# Patient Record
Sex: Female | Born: 2000 | Race: White | Hispanic: No | Marital: Single | Smoking: Never smoker
Health system: Southern US, Community
[De-identification: ages and names within clinical notes are randomized; demographics above are authoritative.]

## PROBLEM LIST (undated history)

## (undated) DIAGNOSIS — Z9109 Other allergy status, other than to drugs and biological substances: Secondary | ICD-10-CM

## (undated) DIAGNOSIS — L0291 Cutaneous abscess, unspecified: Secondary | ICD-10-CM

## (undated) DIAGNOSIS — Z9889 Other specified postprocedural states: Secondary | ICD-10-CM

## (undated) DIAGNOSIS — F988 Other specified behavioral and emotional disorders with onset usually occurring in childhood and adolescence: Secondary | ICD-10-CM

## (undated) DIAGNOSIS — F909 Attention-deficit hyperactivity disorder, unspecified type: Secondary | ICD-10-CM

## (undated) DIAGNOSIS — G43809 Other migraine, not intractable, without status migrainosus: Secondary | ICD-10-CM

## (undated) DIAGNOSIS — E739 Lactose intolerance, unspecified: Secondary | ICD-10-CM

## (undated) DIAGNOSIS — R519 Headache, unspecified: Secondary | ICD-10-CM

## (undated) DIAGNOSIS — K829 Disease of gallbladder, unspecified: Secondary | ICD-10-CM

## (undated) DIAGNOSIS — M2550 Pain in unspecified joint: Secondary | ICD-10-CM

## (undated) DIAGNOSIS — E559 Vitamin D deficiency, unspecified: Secondary | ICD-10-CM

## (undated) DIAGNOSIS — E669 Obesity, unspecified: Secondary | ICD-10-CM

## (undated) DIAGNOSIS — F329 Major depressive disorder, single episode, unspecified: Secondary | ICD-10-CM

## (undated) DIAGNOSIS — N764 Abscess of vulva: Secondary | ICD-10-CM

## (undated) DIAGNOSIS — K589 Irritable bowel syndrome without diarrhea: Secondary | ICD-10-CM

## (undated) DIAGNOSIS — J479 Bronchiectasis, uncomplicated: Secondary | ICD-10-CM

## (undated) DIAGNOSIS — K219 Gastro-esophageal reflux disease without esophagitis: Secondary | ICD-10-CM

## (undated) DIAGNOSIS — K8020 Calculus of gallbladder without cholecystitis without obstruction: Secondary | ICD-10-CM

## (undated) DIAGNOSIS — R112 Nausea with vomiting, unspecified: Secondary | ICD-10-CM

## (undated) DIAGNOSIS — B449 Aspergillosis, unspecified: Secondary | ICD-10-CM

## (undated) DIAGNOSIS — F419 Anxiety disorder, unspecified: Secondary | ICD-10-CM

## (undated) DIAGNOSIS — R12 Heartburn: Secondary | ICD-10-CM

## (undated) DIAGNOSIS — M779 Enthesopathy, unspecified: Secondary | ICD-10-CM

## (undated) DIAGNOSIS — F32A Depression, unspecified: Secondary | ICD-10-CM

## (undated) HISTORY — DX: Depression, unspecified: F32.A

## (undated) HISTORY — DX: Headache, unspecified: R51.9

## (undated) HISTORY — DX: Heartburn: R12

## (undated) HISTORY — PX: RHINOPLASTY: SUR1284

## (undated) HISTORY — DX: Attention-deficit hyperactivity disorder, unspecified type: F90.9

## (undated) HISTORY — DX: Pain in unspecified joint: M25.50

## (undated) HISTORY — DX: Other migraine, not intractable, without status migrainosus: G43.809

## (undated) HISTORY — DX: Lactose intolerance, unspecified: E73.9

## (undated) HISTORY — DX: Bronchiectasis, uncomplicated: J47.9

## (undated) HISTORY — DX: Aspergillosis, unspecified: B44.9

## (undated) HISTORY — DX: Vitamin D deficiency, unspecified: E55.9

## (undated) HISTORY — DX: Disease of gallbladder, unspecified: K82.9

## (undated) HISTORY — DX: Calculus of gallbladder without cholecystitis without obstruction: K80.20

## (undated) HISTORY — DX: Other specified behavioral and emotional disorders with onset usually occurring in childhood and adolescence: F98.8

## (undated) HISTORY — DX: Irritable bowel syndrome, unspecified: K58.9

## (undated) HISTORY — DX: Abscess of vulva: N76.4

---
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
## 2021-01-08 NOTE — Progress Notes (Addendum)
Asthma Control Test for people 12 years and older                                            NAME_______________________________                 MR#________________________________ Circle the most appropriate answer:                          DATE_______________________________ 1. In the past 4 weeks, how much of the time did your asthma keep you from getting as much done at work, school, or home? All of the time                   1 Most of the time                2  Some of the time                3 A little of the time                4 None of the time                   5  2. During the past 4 weeks, how often have you had shortness of breath? More than once a day                            1 Once a day            2 3 to 5 times a week                  3 Once or twice a week                       4 Not at all           5  3. During the past 4 weeks, how often did your asthma symptoms (wheezing, coughing, shortness of breath, chest tightness, or pain) wake you up at night or earlier than usual in the morning? 4 or more nights a week                    1 2 or 3 nights a week                  2 Once a week             3 Once or twice             4 Not at all         5  4. During the past 4 weeks, how often have you used your rescue inhaler or nebulizer medication (such as albuterol, ProAir, Ventolin, or Xopenex)? 3 or more times a day 1 1 or 2 times a day 2 2 or 3 times a week 3 Once a week or less 4 Not at all 5  5. How would you rate your asthma control during the past 4 weeks? Not controlled at all                   1 Poorly controlled  2 Somewhat controlled                   3 Well controlled                4 Completely controlled                    5   Now add up the numbers from each question to get your score: __25_______________________ And answer the questions below: 1. Does your child have an Asthma Action Plan?                          YES           NO   -If yes, does it list your child's current medications?         YES                       NO     Were any new medications for asthma prescribed since your last visit?       YES  NO If yes, please list: _________________________________________________________________________ 2. Has your child had a visit to the ER or an urgent visit to the doctor for asthma since your last visit?   YES   NO -If yes, how many times? _______________________________ 3. Has your child been hospitalized for asthma since your last visit?      YES   NO -If yes, when and where? ________________________________ 5.   Has your child had to miss any school in the last 4 months due to his/her asthma?   YES   NO -If yes, approximately how many days? ____________________ 6    Has your Child had a Flu Shot?  _______________     YES                                  NO          -If yes, when was the Flu Shot given? _______________ (month) 7. Would you like your child to have a Flu Shot today?             YES                 NO

## 2021-01-11 ENCOUNTER — Other Ambulatory Visit: Payer: Self-pay

## 2021-01-11 ENCOUNTER — Encounter (INDEPENDENT_AMBULATORY_CARE_PROVIDER_SITE_OTHER): Payer: Self-pay | Admitting: Pediatrics

## 2021-01-11 ENCOUNTER — Ambulatory Visit (INDEPENDENT_AMBULATORY_CARE_PROVIDER_SITE_OTHER): Payer: Medicaid Other | Admitting: Pediatrics

## 2021-01-11 VITALS — BP 108/70 | HR 108 | Wt >= 6400 oz

## 2021-01-11 DIAGNOSIS — Z902 Acquired absence of lung [part of]: Secondary | ICD-10-CM

## 2021-01-11 DIAGNOSIS — B449 Aspergillosis, unspecified: Secondary | ICD-10-CM | POA: Diagnosis not present

## 2021-01-11 DIAGNOSIS — J45909 Unspecified asthma, uncomplicated: Secondary | ICD-10-CM

## 2021-01-11 DIAGNOSIS — J479 Bronchiectasis, uncomplicated: Secondary | ICD-10-CM

## 2021-01-11 MED ORDER — MONTELUKAST SODIUM 10 MG PO TABS: 10.0000 mg | ORAL_TABLET | Freq: Every day | ORAL | 11 refills | Status: DC

## 2021-01-11 NOTE — Patient Instructions (Signed)
Pediatric Pulmonology  Clinic Discharge Instructions       01/11/21    It was great to see you today Ashley Cantu! You can continue using Singulair (montelukast). No other changes at this time. Please let me know if your asthma symptoms get worse.    Followup: Return in about 9 months (around 10/13/2021).  Please call [phone] with any further questions or concerns.    Pediatric Pulmonology   Asthma Management Plan for Ashley Cantu Printed: 01/11/2021  Asthma Severity: Mild Persistent Asthma Avoid Known Triggers: Tobacco smoke exposure and Respiratory infections (colds)  GREEN ZONE  Child is DOING WELL. No cough and no wheezing. Child is able to do usual activities. Take these Daily Maintenance medications Singulair (Montelukat) 10mg  once a day by mouth at bedtime   YELLOW ZONE  Asthma is GETTING WORSE.  Starting to cough, wheeze, or feel short of breath. Waking at night because of asthma. Can do some activities. 1st Step - Take Quick Relief medicine below.  If possible, remove the child from the thing that made the asthma worse. Albuterol 2-4 puffs   2nd  Step - Do one of the following based on how the response.  If symptoms are not better within 1 hour after the first treatment, call Cox, [name], MD at [phone].  Continue to take GREEN ZONE medications.  If symptoms are better, continue this dose for 2 day(s) and then call the office before stopping the medicine if symptoms have not returned to the [place]. Continue to take GREEN ZONE medications.      RED ZONE  Asthma is VERY BAD. Coughing all the time. Short of breath. Trouble talking, walking or playing. 1st Step - Take Quick Relief medicine below:  Albuterol 4-6 puffs     2nd Step - Call [name], MD at [phone] immediately for further instructions.  Call 911 or go to the Emergency Department if the medications are not working.   Spacer and Mouthpiece  Correct Use of MDI and Spacer with  Mouthpiece  Below are the steps for the correct use of a metered dose inhaler (MDI) and spacer with MOUTHPIECE.  Patient should perform the following steps: 1.  Shake the canister for 5 seconds. 2.  Prime the MDI. (Varies depending on MDI brand, see package insert.) In general: -If MDI not used in 2 weeks or has been dropped: spray 2 puffs into air -If MDI never used before spray 3 puffs into air 3.  Insert the MDI into the spacer. 4.  Place the spacer mouthpiece into your mouth between the teeth. 5.  Close your lips around the mouthpiece and exhale normally. 6.  Press down the top of the canister to release 1 puff of medicine. 7.  Inhale the medicine through the mouth deeply and slowly (3-5 seconds spacer whistles when breathing in too fast.  8.  Hold your breath for 10 seconds and remove the spacer from your mouth before exhaling. 9.  Wait one minute before giving another puff of the medication. 10.Caregiver supervises and advises in the process of medication administration with spacer.             11.Repeat steps 4 through 8 depending on how many puffs are indicated on the prescription.  Cleaning Instructions 1. Remove the rubber end of spacer where the MDI fits. 2. Rotate spacer mouthpiece counter-clockwise and lift up to remove. 3. Lift the valve off the clear posts at the end of the chamber. 4. Soak the  parts in warm water with clear, liquid detergent for about 15 minutes. 5. Rinse in clean water and shake to remove excess water. 6. Allow all parts to air dry. DO NOT dry with a towel.  7. To reassemble, hold chamber upright and place valve over clear posts. Replace spacer mouthpiece and turn it clockwise until it locks into place. Replace the back rubber end onto the spacer.   For more information, go to [email or web address]

## 2021-01-11 NOTE — Progress Notes (Signed)
Pediatric Pulmonology  Clinic Note  01/11/2021  Primary Care Physician: [name], MD   Reason For Visit: Followup for bronchiectasis and fungal pulmonary infection  Assessment and Plan:  Ashley Cantu is a 20 y.o. 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 Recently underwent surgical resection given lack of radiographic and symptomatic improvement after a year on [place]. Doing very well post-surgery and since stopping Cresemba, with no significant symptoms of residual infection. Most recent xray in nov looked great. Lung function is normal now - with no obstruction or restriction. Though she does likely still have some areas of bronchiectasis in her right middle lobe - she does not appear to be having any associated symptoms from that - and we discussed that hopefully will not have infection or other issues in the future.   Plan: - No interventions needed at this time - will need to continue to monitor for signs of infections given some residual bronchiectasis  Asthma - mild persistent: Asthma symptoms much improved, with significant improvement on PFT's. Well controlled now. Will continue Singulair (montelukast).  - continue Singulair (montelukast)  - continue albuterol prn - Asthma Action Plan provided.  Healthcare Maintenance: - Adrena has received a flu vaccine this season.  - Has received 2 covid vaccines and a booster  Followup: Return in about 9 months (around 10/13/2021).      Gwyndolyn Saxon "Will" Guayanilla Cellar, MD [facility] Pediatric Pulmonology [place] Office: [phone] [facility] Office [phone]   Subjective:  Ashley Cantu is a 20 y.o. 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    Ashley Cantu was last seen by myself in clinic on 08/24/2020. At that time, she was doing fairly well following her lobectomy, but was having more asthma symptoms, so we started her on Flovent 110mg 2 puffs BID.   She was seen by Dr. VTommy Medalin January - and the plan was to discontinue the Cresemba later that month.  Ashley Cantu reports that her breathing has been great recently. Her shortness of breath and cough that she was having at her last visit have significantly improved. She never did start the flovent after her last visit due to pharmacy issues, but her symptoms seemed to improve with just Singulair (montelukast). She did stop Cresemba in January - and did not notice any change or worsening of symptoms after stopping. She does not cough up much mucus at all now - and what she does cough up is clear. She does not have shortness of breath/ wheezing with activity, nighttime cough, or chronic cough during the day. She only uses albuterol ~1x/ month.   Allergy symptoms have been a little worse recently in the spring but overall fairly well controlled. Taking Singulair (montelukast) and sometimes nasal fluticasone (Flonase). No apparent medication side effects from Singulair (montelukast).   Past Medical History:   Patient Active Problem List   Diagnosis Date Noted  . Bipolar 2 disorder, major depressive episode ([place] 08/09/2020  . 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  . ADHD (attention deficit hyperactivity disorder) 03/05/2020  . Migraine without aura and without status migrainosus, not intractable 12/14/2019  . 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  Vitamin D deficiency 10/19/2019  . Body mass index (BMI) greater than 99th percentile for age in childhood 10/17/2019  . 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 Frequent episodic tension-type headache, not intractable 07/12/2019  .  Migraine variant with headache 07/12/2019  . Hyperuricemia 04/15/2019  . Asthma 10/15/2018  . Aspergilloma ([place]) 09/28/2018  . Pneumonia with the fungal infection aspergillosis ([place]) 07/02/2018  . Bronchiectasis without complication ([place]) 26/71/2458  . Intermittent hypertension 12/02/2017  . Left genital labial abscess 08/06/2017  . Enlargement of right atrium 04/22/2017  . Hepatic steatosis 04/22/2017  . Gastroesophageal reflux disease 01/23/2017  . Generalized anxiety disorder 09/10/2016  . Obesity with serious comorbidity and body mass index (BMI) greater than 99th percentile for age in pediatric patient 09/10/2016  . Binge eating 09/10/2016  . Severe obesity due to excess calories with serious comorbidity and body mass index (BMI) greater than 99th percentile for age in pediatric patient [facility]) 09/10/2016    Past Surgical History:  Procedure Laterality Date  . CHOLECYSTECTOMY N/A 04/01/2017   Procedure: LAPAROSCOPIC CHOLECYSTECTOMY;  Surgeon: Stanford Scotland, MD;  Location: [place];  Service: General;  Laterality: N/A;  . RHINOPLASTY    . VIDEO BRONCHOSCOPY WITH ENDOBRONCHIAL NAVIGATION N/A 07/11/2020   Procedure: VIDEO BRONCHOSCOPY WITH ENDOBRONCHIAL NAVIGATION  WITH INDOCYANINE GREEN/METHYLENE BLUE MARKING INJECTION OF RIGHT UPPER LOBE MASS;  Surgeon: Grace Isaac, MD;  Location: [facility];  Service: Thoracic;  Laterality: N/A;   Medications:   Current Outpatient Medications:  .  cetirizine (ZYRTEC) 10 MG tablet, Take 10 mg by mouth daily., Disp: , Rfl:  .  fluticasone (FLONASE) 50 MCG/ACT nasal spray, Place 2 sprays into both nostrils daily. , Disp: , Rfl: 5 .  hydrOXYzine (ATARAX/VISTARIL) 10 MG tablet, Take 1 tablet (10 mg total) by mouth 3 (three) times daily as needed., Disp: 90 tablet, Rfl: 2 .  lamoTRIgine (LAMICTAL) 100 MG tablet, Take 1 tablet (100 mg total) by mouth daily., Disp: 30 tablet, Rfl: 2 .  meloxicam (MOBIC) 15 MG tablet, Take 15 mg by mouth daily as needed for  pain. , Disp: , Rfl:  .  NEXIUM 40 MG capsule, Take 40 mg by mouth daily. , Disp: , Rfl: 1 .  SUMAtriptan (IMITREX) 25 MG tablet, Take 1 tablet (25 mg total) by mouth every 2 (two) hours as needed for migraine. May repeat in 2 hours if headache persists or recurs., Disp: 10 tablet, Rfl: 0 .  traZODone (DESYREL) 50 MG tablet, Take 1 tablet (50 mg total) by mouth at bedtime., Disp: 30 tablet, Rfl: 2 .  albuterol (PROVENTIL HFA;VENTOLIN HFA) 108 (90 BASE) MCG/ACT inhaler, Inhale 2 puffs into the lungs every 6 (six) hours as needed for wheezing or shortness of breath.  (Patient not taking: Reported on 01/11/2021), Disp: , Rfl:  .  montelukast (SINGULAIR) 10 MG tablet, Take 1 tablet (10 mg total) by mouth at bedtime., Disp: 30 tablet, Rfl: 11 .  topiramate (TOPAMAX) 25 MG tablet, Take 3 tablets at nighttime (Patient not taking: Reported on 01/11/2021), Disp: 93 tablet, Rfl: 5 .  Vitamin D, Ergocalciferol, (DRISDOL) 1.25 MG (50000 UNIT) CAPS capsule, Take one tablet every Sunday and take one tablet every Wednesday. (Patient not taking: Reported on 01/11/2021), Disp: 8 capsule, Rfl: 0  Social History:   Social History   Social History Narrative   Ashley Cantu received her GED.   She attended [employer].   She lives with her dad only.   She has a younger brother.   She works  at [employer].     Lives with parents in [place] [address].   Objective:  Vitals Signs: BP 108/70   Pulse (!) 108   Wt (!) 461 lb (209.1 kg)   SpO2 97%   BMI 70.09 kg/m   Wt Readings from Last 3 Encounters:  01/11/21 (!) 461 lb (209.1 kg) (>99 %, Z= 3.13)*  10/04/20 (!) 451 lb 3.2 oz (204.7 kg) (>99 %, Z= 3.08)*  09/26/20 (!) 436 lb (197.8 kg) (>99 %, Z= 3.06)*   * Growth percentiles are based on CDC (Girls, 2-20 Years) data.   GENERAL: Appears comfortable and in no respiratory distress.  ENT:  Moist mucous membranes.  NECK:  Supple, without adenopathy.  RESPIRATORY:  Clear to auscultation bilaterally, somewhat  distant, normal work and rate of breathing with no retractions, no crackles or wheezes, with symmetric breath sounds throughout.  No clubbing.  CARDIOVASCULAR:  Regular rate and rhythm without murmur.  Nailbeds are pink. No lower extremity edema.  NEUROLOGIC:  Normal strength and tone x 4.  Medical Decision Making:   Spirometry (% predicted): FVC: 119% FEV1: 108% FEV1/FVC: 91% FEF25-75: 94% Interpretation: Acceptable per ATS criteria. Normal - no obstruction.   08/24/20: FVC: 107%/ 74%/ 29%  02/10/19 % pred: FVC 128%, FEV113%, FEV1/FVC 88%, FEF 25-75% Interpretation: technically not repeatable, but FEV1 is improved from prior and within her prior range. No obstruction per ATS criteria.   07/29/19: FVC 120% FEV1 92% FEV1/FVC 76% FEF25-75 66% Interpretation - consistent with mild observation given decreased FEV1/FVC ratio - slightly decreased since prior testing.   From 09/2018: FVC 128% pred FEV1 122% FEV1/FVC 95% pred FEF25-75% 102% Spirometry is normal today. No significant change from prior testing. Testing meets ATS standards.   Radiology: CT chest 03/19/18: IMPRESSION: 1. Worsening areas of bronchiectasis and chronic post infectious or inflammatory scarring. Today's study also demonstrates an enlarging cavitary area in the medial aspect of the right upper lobe which contain some internal dependent debris. The possibility of a developing mycetoma should be considered. Outpatient referral to Pulmonology for further evaluation is strongly recommended.  CT Chest: 09/07/18 Lungs/Pleura: Nodular airspace disease in the anteromedial right upper lobe is stable. Volume loss with bronchiectasis and airspace consolidation in the right middle lobe is similar to prior. Patchy micro nodular opacity posterior right apex is unchanged. Left lung unremarkable. No pleural effusion.  Upper Abdomen: Unremarkable  Musculoskeletal: No worrisome lytic or sclerotic  osseous abnormality.  IMPRESSION: 1. Stable exam. Areas of bronchiectasis with volume loss and scarring in the right middle lobe. The cavitary disease in the medial right upper lobe seen previously is similar today.  08/15/19: IMPRESSION: 1. No significant change in numerous scattered ground-glass opacities of the right upper lobe (series 3, image 30).  2. 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  3. Unchanged severe bronchiectasis, fibrotic scarring, and volume loss of the right middle lobe, particularly the medial segment.  4. Overall constellation of findings is in keeping with sequelae of aspergillus pneumonia and without evidence of ongoing active Infection.  Flexible bronchoscopy: 06/01/18: Diagnosis: 1. Pharyngomalacia - moderate 2. Bronchitis - moderate and localized to right lung   Discussion: Hala's bronchoscopy revealed moderate pharyngomalacia that could be obstructive, however exam was limited given insertion of LMA. She had thick stringy mucus throughout her right lung, particularly in her right middle  and right upper lobe, consistent with past bronchoscopy at [facility] and her radiographic imaging. A bronchial alveolar lavage sample was collected which will help guide future antimicrobial therapy. Ciliary biopsy will be processed to evaluate for possible primary ciliary dyskinesia (PCD).   Bronchoalveolar lavage culture results:  10,000 CFU/mL Haemophilus influenzae beta-lactamase     Aspergillus fumigatus    Fungal culture: Aspergillus  Bronchoalveolar lavage cell count:  95% neutrophils   AFB smear: negative Respiratory pathogen panel: negative  Ciliary biopsy: Normal   DG Chest 2 View CLINICAL DATA:  Status post right lobectomy.  EXAM: CHEST - 2 VIEW  COMPARISON:  July 13, 2020.  FINDINGS: The heart size and mediastinal contours are within normal limits. Both lungs are clear. No pneumothorax or pleural effusion is noted. The visualized skeletal structures are unremarkable.  IMPRESSION: No active cardiopulmonary disease.  Electronically Signed   By: Marijo Conception M.D.   On: 07/24/2020 12:42  Asthma Control Test: 25 Indicating that asthma is well controlled (20 or greater)

## 2021-01-22 ENCOUNTER — Other Ambulatory Visit: Payer: Self-pay

## 2021-01-22 ENCOUNTER — Ambulatory Visit (INDEPENDENT_AMBULATORY_CARE_PROVIDER_SITE_OTHER): Payer: Medicaid Other | Admitting: Licensed Clinical Social Worker

## 2021-01-22 DIAGNOSIS — F3181 Bipolar II disorder: Secondary | ICD-10-CM

## 2021-01-22 DIAGNOSIS — F411 Generalized anxiety disorder: Secondary | ICD-10-CM | POA: Diagnosis not present

## 2021-01-22 NOTE — Progress Notes (Signed)
   THERAPIST PROGRESS NOTE  Session Time: 32   Participation Level: Active  Behavioral Response: CasualAlertAnxious  Type of Therapy: Individual Therapy  Treatment Goals addressed: Diagnosis: GAD and bipolar depression   Interventions: CBT and Supportive  Summary: Ashley Cantu is a 20 y.o. female who presents with: bipolar disorder and GAD    Suicidal/Homicidal: NAwithout intent/plan  Therapist Response:    Subjective/Objective:  Pt was alert and oriented x 5. She was dressed casually and engaged well in therapy session. Pt presented with euthymic mood/affect.   She presented today after 2 months of not being seen. This was a scheduled visit for discharge from practice as pt states the symptoms have been improving steadily. This was as evidence by original PHQ-9 score being 19 and last score being a 4. Ashley Cantu is comfortable with discharge plan to remain on medication.    Assessment: Pt endorse an improvement in depression symptoms for worthlessness, hopelessness, fatigue, and irritability. She is taking her medications as prescribed. It was explained to pt that she is more than welcome to come back to practice for therapy for reassessment     Plan: Return again in 45   weeks.      Dory Horn, LCSW 01/22/2021

## 2021-01-24 ENCOUNTER — Ambulatory Visit (INDEPENDENT_AMBULATORY_CARE_PROVIDER_SITE_OTHER): Payer: Medicaid Other | Admitting: Pediatrics

## 2021-01-24 ENCOUNTER — Other Ambulatory Visit: Payer: Self-pay

## 2021-01-24 ENCOUNTER — Encounter (INDEPENDENT_AMBULATORY_CARE_PROVIDER_SITE_OTHER): Payer: Self-pay

## 2021-01-24 ENCOUNTER — Encounter (INDEPENDENT_AMBULATORY_CARE_PROVIDER_SITE_OTHER): Payer: Self-pay | Admitting: Pediatrics

## 2021-01-24 VITALS — BP 120/70 | HR 80 | Ht 68.0 in | Wt >= 6400 oz

## 2021-01-24 DIAGNOSIS — G43009 Migraine without aura, not intractable, without status migrainosus: Secondary | ICD-10-CM

## 2021-01-24 DIAGNOSIS — E878 Other disorders of electrolyte and fluid balance, not elsewhere classified: Secondary | ICD-10-CM

## 2021-01-24 DIAGNOSIS — Z68.41 Body mass index (BMI) pediatric, greater than or equal to 95th percentile for age: Secondary | ICD-10-CM | POA: Diagnosis not present

## 2021-01-24 DIAGNOSIS — G43809 Other migraine, not intractable, without status migrainosus: Secondary | ICD-10-CM

## 2021-01-24 MED ORDER — TOPIRAMATE 25 MG PO TABS: ORAL_TABLET | ORAL | 5 refills | Status: DC

## 2021-01-24 NOTE — Patient Instructions (Signed)
It was a pleasure to see you.  I am glad that your symptoms have been controlled completely with topiramate.  Please let me know if there is any change between now and when I see you again in 4 months.  We will try to identify a specific adult provider for you.

## 2021-01-24 NOTE — Progress Notes (Signed)
Patient: Ashley Cantu MRN: [id number] Sex: female DOB: Jan 31, 2001  Provider: Wyline Copas, MD Location of Care: [facility] Child Neurology  Note type: Routine return visit  History of Present Illness: Referral Source: Casilda Carls, MD History from: patient and [facility] chart Chief Complaint: Headache  Ashley Cantu is a 20 y.o. female who was evaluated Jan 24, 2021 for the first time since September 26, 2020.  She has migraine variant associated with disequilibrium and nausea and also migraine without aura with headaches that are frontal and vertex associated with nausea without vomiting and a feeling of near syncope associated with her dizziness.  She denies sensitivity to light and sound.  Pain is incapacitating.  She has not experienced any headaches or other symptoms since her last visit.  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  She is been vaccinated for COVID and got boosted.  She has not contracted COVID.  She takes and tolerates topiramate.  She is a Programmer, systems.  She will attend an [employer] that is located in [place].  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  Review of Systems: A complete review of systems was assessed and was negative.  Past Medical History Diagnosis Date  . Abscess   . Abscess of right genital labia 10/14/2018  . ADD (attention deficit disorder)   . ADHD   . Anxiety    and depression  . Aspergilloma ([place]) 09/28/2018  . Asthma when she was a baby  . Bone spur   . Bronchiectasis ([place])   . Cholelithiasis 04/01/2017  . Cough 10/15/2018  . Depression   . Environmental allergies   . Gallbladder problem   . GERD (gastroesophageal reflux disease)   . Headache   . Heartburn   . IBS (irritable bowel syndrome)   . Joint pain   . Lactose intolerance   . Migraine variant   . Obesity   . PONV (postoperative nausea and vomiting)    with  the rhinoplasty surgery  . Vitamin D deficiency    Hospitalizations: No., Head Injury: No., Nervous System Infections: No., Immunizations up to date: Yes.    Birth History 8lbs. 0oz. infant born at [redacted]weeks gestational age to a g 1p 60female. Gestation wasuncomplicated Mother receivedEpidural anesthesia Primarycesarean section Nursery Course wasuncomplicated, breast-fed 2 to 3 months Growth and Development wasrecalled asnormal  Behavior History Generalized anxiety disorder  Surgical History Procedure Laterality Date  . CHOLECYSTECTOMY N/A 04/01/2017   Procedure: LAPAROSCOPIC CHOLECYSTECTOMY;  Surgeon: Stanford Scotland, MD;  Location: [place];  Service: General;  Laterality: N/A;  . RHINOPLASTY    . VIDEO BRONCHOSCOPY WITH ENDOBRONCHIAL NAVIGATION N/A 07/11/2020   Procedure: VIDEO BRONCHOSCOPY WITH ENDOBRONCHIAL NAVIGATION  WITH INDOCYANINE GREEN/METHYLENE BLUE MARKING INJECTION OF RIGHT UPPER LOBE MASS;  Surgeon: Grace Isaac, MD;  Location: [place];  Service: Thoracic;  Laterality: N/A;   Family History family history includes Alcohol abuse in her mother; Asthma in her father, maternal grandfather, maternal grandmother, mother, paternal grandfather, and paternal grandmother; Atrial fibrillation in her paternal grandmother; Bipolar disorder in her maternal grandfather and mother; COPD in her maternal grandmother; Congestive Heart Failure in her paternal grandfather; Depression in her father and mother; Diabetes in her paternal grandfather; Drug abuse in her mother; Heart disease in her mother; High blood pressure in her father; Hypertension in her mother; Liver disease in her mother; Mental  illness in her mother; Obesity in her father and mother; Schizophrenia in her maternal grandfather and mother; Seizures in her paternal grandmother; Sleep apnea in her father. Family history is negative for migraines, intellectual disabilities, blindness, deafness, birth defects, chromosomal  disorder, or autism.  Social History Socioeconomic History  . Marital status: Single  . Years of education:  86  . Highest education level:  High school graduate  Occupational History  . Occupation: Scientist, water quality in a Land  Tobacco Use  . Smoking status: Passive Smoke Exposure - Never Smoker  . Smokeless tobacco: Never Used  Vaping Use  . Vaping Use: Never used  Substance and Sexual Activity  . Alcohol use: No  . Drug use: No  . Sexual activity: Never    Birth control/protection: Abstinence  Social History Narrative    Elza received her GED.    She attended [employer].    She lives with her dad only.    She has a younger brother.    She works at [employer].   Social Determinants of Health   Financial Resource Strain: Low Risk   . Difficulty of Paying Living Expenses: Not hard at all  Food Insecurity: No Food Insecurity  . Worried About Charity fundraiser in the Last Year: Never true  . Ran Out of Food in the Last Year: Never true  Transportation Needs: No Transportation Needs  . Lack of Transportation (Medical): No  . Lack of Transportation (Non-Medical): No  Physical Activity: Insufficiently Active  . Days of Exercise per Week: 1 day  . Minutes of Exercise per Session: 60 min  Stress: Stress Concern Present  . Feeling of Stress : To some extent  Social Connections: Socially Isolated  . Frequency of Communication with Friends and Family: Never  . Frequency of Social Gatherings with Friends and Family: Once a week  . Attends Religious Services: Never  . Active Member of Clubs or Organizations: No  . Attends Archivist Meetings: Never  . Marital Status: Never married   Allergies Allergen Reactions  . Penicillins Anaphylaxis, Hives and Swelling    Has patient had a PCN reaction causing immediate rash, facial/tongue/throat swelling, SOB or lightheadedness with hypotension: yes Has patient had a PCN reaction causing severe rash involving mucus  membranes or skin necrosis: no Has patient had a PCN reaction that required hospitalization: no Has patient had a PCN reaction occurring within the last 10 years: bo If all of the above answers are "NO", then may proceed with Cephalosporin use.     Physical Exam BP 120/70   Pulse 80   Ht 5\' 8"  (1.727 m)   Wt (!) 466 lb (211.4 kg)   BMI 70.86 kg/m   General: alert, well developed, obese, in no acute distress, sandy hair, brown eyes, even-handed Head: normocephalic, no dysmorphic features Ears, Nose and Throat: Otoscopic: tympanic membranes normal; pharynx: oropharynx is pink without exudates or tonsillar hypertrophy Neck: supple, full range of motion, no cranial or cervical bruits Respiratory: auscultation clear Cardiovascular: no murmurs, pulses are normal Musculoskeletal: no skeletal deformities or apparent scoliosis Skin: no rashes or neurocutaneous lesions  Neurologic Exam  Mental Status: alert; oriented to person, place and year; knowledge is normal for age; language is normal Cranial Nerves: visual fields are full to double simultaneous stimuli; extraocular movements are full and conjugate; pupils are round reactive to light; funduscopic examination shows Moret disc margins with normal vessels; symmetric facial strength; midline tongue and uvula; air conduction is greater  than bone conduction bilaterally Motor: Normal strength, tone and mass; good fine motor movements; no pronator drift Sensory: intact responses to cold, vibration, proprioception and stereognosis Coordination: good finger-to-nose, rapid repetitive alternating movements and finger apposition Gait and Station: normal gait and station: patient is able to walk on heels, toes and tandem without difficulty; balance is adequate; Romberg exam is negative; Gower response is negative Reflexes: symmetric and diminished bilaterally; no clonus; bilateral flexor plantar responses  Assessment 1.  Migraine variant with  headache, G43.809. 2.  Migraine without aura without status migrainosus, not intractable, G43.009.  Discussion Anneliese is doing extremely well.  There is no reason to change her current medication.  Plan I refilled her prescription for topiramate.  She will return in 4 months.  At that time I intend to refer her to one of the adult neurologist for ongoing care.  Greater than 50% of a 20-minute visit was spent in counseling and coordination of care concerning her headaches and transition of care.   Medication List   Accurate as of Jan 24, 2021  4:37 PM. If you have any questions, ask your nurse or doctor.      TAKE these medications   albuterol 108 (90 Base) MCG/ACT inhaler Commonly known as: VENTOLIN HFA Inhale 2 puffs into the lungs every 6 (six) hours as needed for wheezing or shortness of breath.   cetirizine 10 MG tablet Commonly known as: ZYRTEC Take 10 mg by mouth daily.   fluticasone 50 MCG/ACT nasal spray Commonly known as: FLONASE Place 2 sprays into both nostrils daily.   hydrOXYzine 10 MG tablet Commonly known as: ATARAX/VISTARIL Take 1 tablet (10 mg total) by mouth 3 (three) times daily as needed.   lamoTRIgine 100 MG tablet Commonly known as: LaMICtal Take 1 tablet (100 mg total) by mouth daily.   montelukast 10 MG tablet Commonly known as: SINGULAIR Take 1 tablet (10 mg total) by mouth at bedtime.   NexIUM 40 MG capsule Generic drug: esomeprazole Take 40 mg by mouth daily.   topiramate 25 MG tablet Commonly known as: TOPAMAX Take 3 1/2 tablets at nighttime What changed: additional instructions Changed by: Wyline Copas, MD   traZODone 50 MG tablet Commonly known as: DESYREL Take 1 tablet (50 mg total) by mouth at bedtime.   Vitamin D (Ergocalciferol) 1.25 MG (50000 UNIT) Caps capsule Commonly known as: DRISDOL Take one tablet every Sunday and take one tablet every Wednesday.    The medication list was reviewed and reconciled. All changes or  newly prescribed medications were explained.  A complete medication list was provided to the patient/caregiver.  Jodi Geralds MD

## 2021-02-04 ENCOUNTER — Ambulatory Visit (INDEPENDENT_AMBULATORY_CARE_PROVIDER_SITE_OTHER): Payer: Medicaid Other | Admitting: Infectious Disease

## 2021-02-04 ENCOUNTER — Encounter: Payer: Self-pay | Admitting: Infectious Disease

## 2021-02-04 ENCOUNTER — Other Ambulatory Visit: Payer: Self-pay

## 2021-02-04 VITALS — BP 148/83 | HR 84 | Temp 97.9°F | Wt >= 6400 oz

## 2021-02-04 DIAGNOSIS — E669 Obesity, unspecified: Secondary | ICD-10-CM | POA: Diagnosis not present

## 2021-02-04 DIAGNOSIS — J479 Bronchiectasis, uncomplicated: Secondary | ICD-10-CM | POA: Diagnosis not present

## 2021-02-04 DIAGNOSIS — B449 Aspergillosis, unspecified: Secondary | ICD-10-CM | POA: Diagnosis present

## 2021-02-04 DIAGNOSIS — Z68.41 Body mass index (BMI) pediatric, greater than or equal to 95th percentile for age: Secondary | ICD-10-CM | POA: Diagnosis not present

## 2021-02-04 NOTE — Progress Notes (Signed)
Chief complaint : Follow-up for aspergilloma sp resection  Subjective:    Patient ID: Ashley Cantu, female    DOB: 11/15/2000, 20 y.o.   MRN: [id number]  HPI  20year-old Caucasian female with a history of right upper lobe and right middle lobe bronchiectatic disease of unknown etiology. The former has been extensively worked up at [facility] and no clear-cut cause ever identified. She has been followed here in [facility] by Dr. [name] Boots pediatric pulmonary. In the last year she was having worsening cough and sputum production and had a CT scan performed in June which showed worsening bronchiectatic changes with scarring. In addition to that there was an enlarging cavity in the medial aspect of the right upper lobe which had some internal debris concerning for potential developing mycetoma.  Bronchoscopy was performed on September 2019. There was thick stringy yellow-white mucus seen throughout the distal trachea and all lobes of the right lung in particular right middle lung and right upper lobe. Left lung was without any secretions. Bronchial lavage was obtained from the right middle lobe and a brush biopsy was sent as well. 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  Patient was followed subsequently closely by Dr. Troy Cellar. She was placed on voriconazole but had persistent symptoms of blurry vision and a headache for the 2 months that she was on it. She was then changed over to [place].  She tolerated theCresemba much better than the voriconazole. A CT scan done December 2019 showed stability of a right upper cavitary lesion.  Dr.Stoudemire referred her to [place] in infectious disease so that she can be followed for this fungal infection.  [id number] involuntary weight loss but rather struggles with being overweight.   She has been seen and followed by Dr. Stem Cellar and had a repeat CT scan   IMPRESSION: 1. No  significant change in numerous scattered ground-glass opacities of the right upper lobe (series 3, image 30).  2. 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  3. Unchanged severe bronchiectasis, fibrotic scarring, and volume loss of the right middle lobe, particularly the medial segment.  4. Overall constellation of findings is in keeping with sequelae of aspergillus pneumonia and without evidence of ongoing active infection.  She had briefly stopped the Cresemba but then was instructed to resume it until she could visit with me  At one of our last telephone visit we decided to take her off of the [place]..  She had remained stable off of it and I saw her in person in follow-up March 2021.  Marland Kitchen Continues to be followed by Dr. Yuba Cellar is also been seen now by Dr. with Cardiothoracic surgery.  Dr. Servando Snare wanted her to lose weight prior to the elective surgery.  Was able to lose sufficient weight and underwent resection of her aspergilloma with lobectomy performed on the 20th 2021.  We placed her back on antifungals and transition her to Cresemb  She completed her course semba in January and today was a follow-up visit to see how she is doing 4 months off Cresemba.  She has not had any worsening cough fever or increase in sputum production.  She is being followed closely by Dr. Groesbeck Cellar with pulmonary.  Think we have cured this particular infection thanks to CT surgery.     Past Medical History:  Diagnosis Date  .  Abscess   . Abscess of right genital labia 10/14/2018  . ADD (attention deficit disorder)   . ADHD   . Anxiety    and depression  . Aspergilloma ([place]) 09/28/2018  . Asthma when she was a baby  . Bone spur   . Bronchiectasis ([place])   . Cholelithiasis 04/01/2017  . Cough 10/15/2018  .  Depression   . Environmental allergies   . Gallbladder problem   . GERD (gastroesophageal reflux disease)   . Headache   . Heartburn   . IBS (irritable bowel syndrome)   . Joint pain   . Lactose intolerance   . Migraine variant   . Obesity   . PONV (postoperative nausea and vomiting)    with the rhinoplasty surgery  . Vitamin D deficiency     Past Surgical History:  Procedure Laterality Date  . CHOLECYSTECTOMY N/A 04/01/2017   Procedure: LAPAROSCOPIC CHOLECYSTECTOMY;  Surgeon: Stanford Scotland, MD;  Location: [place];  Service: General;  Laterality: N/A;  . RHINOPLASTY    . VIDEO BRONCHOSCOPY WITH ENDOBRONCHIAL NAVIGATION N/A 07/11/2020   Procedure: VIDEO BRONCHOSCOPY WITH ENDOBRONCHIAL NAVIGATION  WITH INDOCYANINE GREEN/METHYLENE BLUE MARKING INJECTION OF RIGHT UPPER LOBE MASS;  Surgeon: Grace Isaac, MD;  Location: [facility] OR;  Service: Thoracic;  Laterality: N/A;    Family History  Problem Relation Age of Onset  . Hypertension Mother   . Mental illness Mother   . Depression Mother   . Bipolar disorder Mother   . Schizophrenia Mother   . Heart disease Mother   . Liver disease Mother   . Alcohol abuse Mother   . Drug abuse Mother   . Obesity Mother   . Asthma Mother   . COPD Maternal Grandmother   . Asthma Maternal Grandmother   . Schizophrenia Maternal Grandfather   . Bipolar disorder Maternal Grandfather   . Asthma Maternal Grandfather   . Seizures Paternal Grandmother   . Atrial fibrillation Paternal Grandmother   . Asthma Paternal Grandmother   . Diabetes Paternal Grandfather   . Congestive Heart Failure Paternal Grandfather   . Asthma Paternal Grandfather   . High blood pressure Father   . Depression Father   . Sleep apnea Father   . Obesity Father   . Asthma Father       Social History   Socioeconomic History  . Marital status: Single    Spouse name: Not on file  . Number of children: Not on file  . Years of education: Not on file  . Highest  education level: Not on file  Occupational History  . Occupation: Scientist, water quality  Tobacco Use  . Smoking status: Passive Smoke Exposure - Never Smoker  . Smokeless tobacco: Never Used  Vaping Use  . Vaping Use: Never used  Substance and Sexual Activity  . Alcohol use: No  . Drug use: No  . Sexual activity: Never    Birth control/protection: Abstinence  Other Topics Concern  . Not on file  Social History Narrative   [place] received her GED.   She attended [employer].   She lives with her dad only.   She has a younger brother.   She works at [employer].   Social Determinants of Health   Financial Resource Strain: Low Risk   . Difficulty of Paying Living Expenses: Not hard at all  Food Insecurity: No Food Insecurity  . Worried About Charity fundraiser in the Last Year: Never true  . Ran Out of Food in  the Last Year: Never true  Transportation Needs: No Transportation Needs  . Lack of Transportation (Medical): No  . Lack of Transportation (Non-Medical): No  Physical Activity: Insufficiently Active  . Days of Exercise per Week: 1 day  . Minutes of Exercise per Session: 60 min  Stress: Stress Concern Present  . Feeling of Stress : To some extent  Social Connections: Socially Isolated  . Frequency of Communication with Friends and Family: Never  . Frequency of Social Gatherings with Friends and Family: Once a week  . Attends Religious Services: Never  . Active Member of Clubs or Organizations: No  . Attends Archivist Meetings: Never  . Marital Status: Never married    Allergies  Allergen Reactions  . Penicillins Anaphylaxis, Hives and Swelling    Has patient had a PCN reaction causing immediate rash, facial/tongue/throat swelling, SOB or lightheadedness with hypotension: yes Has patient had a PCN reaction causing severe rash involving mucus membranes or skin necrosis: no Has patient had a PCN reaction that required hospitalization: no Has patient had a PCN  reaction occurring within the last 10 years: bo If all of the above answers are "NO", then may proceed with Cephalosporin use.      Current Outpatient Medications:  .  albuterol (PROVENTIL HFA;VENTOLIN HFA) 108 (90 BASE) MCG/ACT inhaler, Inhale 2 puffs into the lungs every 6 (six) hours as needed for wheezing or shortness of breath., Disp: , Rfl:  .  cetirizine (ZYRTEC) 10 MG tablet, Take 10 mg by mouth daily., Disp: , Rfl:  .  fluticasone (FLONASE) 50 MCG/ACT nasal spray, Place 2 sprays into both nostrils daily. , Disp: , Rfl: 5 .  hydrOXYzine (ATARAX/VISTARIL) 10 MG tablet, Take 1 tablet (10 mg total) by mouth 3 (three) times daily as needed., Disp: 90 tablet, Rfl: 2 .  lamoTRIgine (LAMICTAL) 100 MG tablet, Take 1 tablet (100 mg total) by mouth daily., Disp: 30 tablet, Rfl: 2 .  montelukast (SINGULAIR) 10 MG tablet, Take 1 tablet (10 mg total) by mouth at bedtime., Disp: 30 tablet, Rfl: 11 .  NEXIUM 40 MG capsule, Take 40 mg by mouth daily. , Disp: , Rfl: 1 .  topiramate (TOPAMAX) 25 MG tablet, Take 3 1/2 tablets at nighttime, Disp: 110 tablet, Rfl: 5 .  traZODone (DESYREL) 50 MG tablet, Take 1 tablet (50 mg total) by mouth at bedtime., Disp: 30 tablet, Rfl: 2 .  Vitamin D, Ergocalciferol, (DRISDOL) 1.25 MG (50000 UNIT) CAPS capsule, Take one tablet every Sunday and take one tablet every Wednesday., Disp: 8 capsule, Rfl: 0       Review of Systems  Constitutional: Negative for activity change, appetite change, chills, diaphoresis, fatigue, fever and unexpected weight change.  HENT: Negative for congestion, rhinorrhea, sinus pressure, sneezing, sore throat and trouble swallowing.   Eyes: Negative for photophobia and visual disturbance.  Respiratory: Positive for cough. Negative for chest tightness, shortness of breath, wheezing and stridor.   Cardiovascular: Negative for chest pain, palpitations and leg swelling.  Gastrointestinal: Negative for abdominal distention, abdominal pain, anal  bleeding, blood in stool, constipation, diarrhea, nausea and vomiting.  Genitourinary: Negative for difficulty urinating, dysuria, flank pain and hematuria.  Musculoskeletal: Negative for arthralgias, back pain, gait problem, joint swelling and myalgias.  Skin: Negative for color change, pallor, rash and wound.  Neurological: Negative for dizziness, tremors, weakness and light-headedness.  Hematological: Negative for adenopathy. Does not bruise/bleed easily.  Psychiatric/Behavioral: Negative for agitation, behavioral problems, confusion, decreased concentration, dysphoric mood and sleep disturbance.  Objective:   Physical Exam Constitutional:      General: She is not in acute distress.    Appearance: Normal appearance. She is well-developed. She is obese. She is not ill-appearing or diaphoretic.  HENT:     Head: Normocephalic and atraumatic.     Right Ear: Hearing and external ear normal.     Left Ear: Hearing and external ear normal.     Nose: No nasal deformity or rhinorrhea.  Eyes:     General: No scleral icterus.    Extraocular Movements: Extraocular movements intact.     Conjunctiva/sclera: Conjunctivae normal.     Right eye: Right conjunctiva is not injected.     Left eye: Left conjunctiva is not injected.  Neck:     Vascular: No JVD.  Cardiovascular:     Rate and Rhythm: Normal rate and regular rhythm.     Heart sounds: Normal heart sounds, S1 normal and S2 normal. No murmur heard. No friction rub. No gallop.   Pulmonary:     Effort: Pulmonary effort is normal. No respiratory distress.     Breath sounds: Normal breath sounds. No stridor. No wheezing, rhonchi or rales.  Abdominal:     General: Bowel sounds are normal. There is no distension.     Palpations: Abdomen is soft. There is no mass.     Tenderness: There is no abdominal tenderness.     Hernia: No hernia is present.  Musculoskeletal:        General: Normal range of motion.     Right shoulder: Normal.      Left shoulder: Normal.     Cervical back: Normal range of motion and neck supple.     Right hip: Normal.     Left hip: Normal.     Right knee: Normal.     Left knee: Normal.  Lymphadenopathy:     Head:     Right side of head: No submandibular, preauricular or posterior auricular adenopathy.     Left side of head: No submandibular, preauricular or posterior auricular adenopathy.     Cervical: No cervical adenopathy.     Right cervical: No superficial or deep cervical adenopathy.    Left cervical: No superficial or deep cervical adenopathy.  Skin:    General: Skin is warm and dry.     Coloration: Skin is not pale.     Findings: No abrasion, bruising, ecchymosis, erythema, lesion or rash.     Nails: There is no clubbing.  Neurological:     Mental Status: She is alert and oriented to person, place, and time.     Sensory: No sensory deficit.     Coordination: Coordination normal.     Gait: Gait normal.  Psychiatric:        Attention and Perception: She is attentive.        Mood and Affect: Mood normal.        Speech: Speech normal.        Behavior: Behavior normal. Behavior is cooperative.        Thought Content: Thought content normal.        Judgment: Judgment normal.           Assessment & Plan:    Bronchiectasis: Following with Dr. Draper Cellar  Aspergilloma  she seems to have cured this with resection and 4 months of postoperative antifungal therapy  Obesity: Need to work on losing weight.  COVID-19 prevention has had 3 doses of vaccine  I spent greater than  30 minutes with the patient including greater than 50% of time in face to face counsel of the patient in reviewing her radiographic data myself, laboratory and microbiological data.

## 2021-02-13 ENCOUNTER — Telehealth (INDEPENDENT_AMBULATORY_CARE_PROVIDER_SITE_OTHER): Payer: Self-pay | Admitting: Pediatrics

## 2021-02-13 DIAGNOSIS — G43009 Migraine without aura, not intractable, without status migrainosus: Secondary | ICD-10-CM

## 2021-02-13 DIAGNOSIS — E878 Other disorders of electrolyte and fluid balance, not elsewhere classified: Secondary | ICD-10-CM

## 2021-02-13 DIAGNOSIS — G43809 Other migraine, not intractable, without status migrainosus: Secondary | ICD-10-CM

## 2021-02-13 NOTE — Telephone Encounter (Signed)
I requested transfer of care to [facility] with a female provider.

## 2021-03-12 ENCOUNTER — Telehealth (INDEPENDENT_AMBULATORY_CARE_PROVIDER_SITE_OTHER): Payer: Medicaid Other | Admitting: Psychiatry

## 2021-03-12 ENCOUNTER — Other Ambulatory Visit: Payer: Self-pay

## 2021-03-12 ENCOUNTER — Encounter (HOSPITAL_COMMUNITY): Payer: Self-pay | Admitting: Psychiatry

## 2021-03-12 ENCOUNTER — Encounter (HOSPITAL_COMMUNITY): Payer: Self-pay | Admitting: Emergency Medicine

## 2021-03-12 ENCOUNTER — Telehealth (HOSPITAL_COMMUNITY): Payer: Medicaid Other | Admitting: Psychiatry

## 2021-03-12 ENCOUNTER — Emergency Department (HOSPITAL_COMMUNITY)
Admission: EM | Admit: 2021-03-12 | Discharge: 2021-03-12 | Disposition: A | Discharge status: Home / Self Care | Payer: Medicaid Other | Attending: Emergency Medicine | Admitting: Emergency Medicine

## 2021-03-12 DIAGNOSIS — A084 Viral intestinal infection, unspecified: Secondary | ICD-10-CM | POA: Insufficient documentation

## 2021-03-12 DIAGNOSIS — Z7722 Contact with and (suspected) exposure to environmental tobacco smoke (acute) (chronic): Secondary | ICD-10-CM | POA: Diagnosis not present

## 2021-03-12 DIAGNOSIS — Z20822 Contact with and (suspected) exposure to covid-19: Secondary | ICD-10-CM | POA: Insufficient documentation

## 2021-03-12 DIAGNOSIS — R1110 Vomiting, unspecified: Secondary | ICD-10-CM | POA: Diagnosis present

## 2021-03-12 DIAGNOSIS — F3181 Bipolar II disorder: Secondary | ICD-10-CM | POA: Diagnosis not present

## 2021-03-12 DIAGNOSIS — F411 Generalized anxiety disorder: Secondary | ICD-10-CM

## 2021-03-12 DIAGNOSIS — R519 Headache, unspecified: Secondary | ICD-10-CM | POA: Diagnosis not present

## 2021-03-12 DIAGNOSIS — D72829 Elevated white blood cell count, unspecified: Secondary | ICD-10-CM | POA: Diagnosis not present

## 2021-03-12 DIAGNOSIS — J45909 Unspecified asthma, uncomplicated: Secondary | ICD-10-CM | POA: Diagnosis not present

## 2021-03-12 DIAGNOSIS — Z7951 Long term (current) use of inhaled steroids: Secondary | ICD-10-CM | POA: Insufficient documentation

## 2021-03-12 LAB — CBC
HCT: 43.3 % (ref 36.0–46.0)
Hemoglobin: 13.7 g/dL (ref 12.0–15.0)
MCH: 24.9 pg — ABNORMAL LOW (ref 26.0–34.0)
MCHC: 31.6 g/dL (ref 30.0–36.0)
MCV: 78.6 fL — ABNORMAL LOW (ref 80.0–100.0)
Platelets: 310 10*3/uL (ref 150–400)
RBC: 5.51 MIL/uL — ABNORMAL HIGH (ref 3.87–5.11)
RDW: 14.2 % (ref 11.5–15.5)
WBC: 15.8 10*3/uL — ABNORMAL HIGH (ref 4.0–10.5)
nRBC: 0 % (ref 0.0–0.2)

## 2021-03-12 LAB — COMPREHENSIVE METABOLIC PANEL
ALT: 18 U/L (ref 0–44)
AST: 20 U/L (ref 15–41)
Albumin: 4.4 g/dL (ref 3.5–5.0)
Alkaline Phosphatase: 81 U/L (ref 38–126)
Anion gap: 12 (ref 5–15)
BUN: 18 mg/dL (ref 6–20)
CO2: 18 mmol/L — ABNORMAL LOW (ref 22–32)
Calcium: 9.2 mg/dL (ref 8.9–10.3)
Chloride: 108 mmol/L (ref 98–111)
Creatinine, Ser: 0.72 mg/dL (ref 0.44–1.00)
GFR, Estimated: >60 mL/min (ref >60)
Glucose, Bld: 146 mg/dL — ABNORMAL HIGH (ref 70–99)
Potassium: 3.8 mmol/L (ref 3.5–5.1)
Sodium: 138 mmol/L (ref 135–145)
Total Bilirubin: 0.5 mg/dL (ref 0.3–1.2)
Total Protein: 8.2 g/dL — ABNORMAL HIGH (ref 6.5–8.1)

## 2021-03-12 LAB — I-STAT BETA HCG BLOOD, ED (MC, WL, AP ONLY): I-stat hCG, quantitative: <5 m[IU]/mL (ref <5)

## 2021-03-12 LAB — RESP PANEL BY RT-PCR (FLU A&B, COVID) ARPGX2
Influenza A by PCR: NEGATIVE
Influenza B by PCR: NEGATIVE
SARS Coronavirus 2 by RT PCR: NEGATIVE

## 2021-03-12 LAB — LIPASE, BLOOD: Lipase: 26 U/L (ref 11–51)

## 2021-03-12 MED ORDER — ONDANSETRON 4 MG PO TBDP: 4.0000 mg | ORAL_TABLET | Freq: Three times a day (TID) | ORAL | 0 refills | Status: AC | PRN

## 2021-03-12 MED ORDER — PROCHLORPERAZINE EDISYLATE 10 MG/2ML IJ SOLN
10.0000 mg | Freq: Once | INTRAMUSCULAR | Status: AC
  Administered 2021-03-12: 10 mg via INTRAVENOUS
  Filled 2021-03-12: qty 2

## 2021-03-12 MED ORDER — ONDANSETRON 4 MG PO TBDP
4.0000 mg | ORAL_TABLET | Freq: Once | ORAL | Status: AC | PRN
  Administered 2021-03-12: 4 mg via ORAL
  Filled 2021-03-12: qty 1

## 2021-03-12 MED ORDER — HYDROXYZINE HCL 10 MG PO TABS: 10.0000 mg | ORAL_TABLET | Freq: Three times a day (TID) | ORAL | 2 refills | Status: DC | PRN

## 2021-03-12 MED ORDER — SODIUM CHLORIDE 0.9 % IV BOLUS (SEPSIS)
1000.0000 mL | Freq: Once | INTRAVENOUS | Status: AC
  Administered 2021-03-12: 1000 mL via INTRAVENOUS

## 2021-03-12 MED ORDER — TRAZODONE HCL 50 MG PO TABS: 50.0000 mg | ORAL_TABLET | Freq: Every day | ORAL | 2 refills | Status: DC

## 2021-03-12 MED ORDER — LAMOTRIGINE 100 MG PO TABS: 100.0000 mg | ORAL_TABLET | Freq: Every day | ORAL | 2 refills | Status: DC

## 2021-03-12 MED ORDER — SODIUM CHLORIDE 0.9 % IV SOLN
1000.0000 mL | INTRAVENOUS | Status: DC
  Administered 2021-03-12: 1000 mL via INTRAVENOUS

## 2021-03-12 MED ORDER — DIPHENHYDRAMINE HCL 50 MG/ML IJ SOLN
12.5000 mg | Freq: Once | INTRAMUSCULAR | Status: AC
  Administered 2021-03-12: 12.5 mg via INTRAVENOUS
  Filled 2021-03-12: qty 1

## 2021-03-12 NOTE — ED Provider Notes (Signed)
[facility] DEPT Provider Note  CSN: [id number] Arrival date & time: 03/12/21 6314  Chief Complaint(s) Vomiting  HPI Ashley Cantu is a 20 y.o. female here for nonbloody nonbilious emesis that began yesterday afternoon. Patient reports her brother had similar GI symptoms starting 2 days ago.  Patient endorsing headache is tension-like with the vomiting.  She also has lower abdominal cramping.  No diarrhea.  Denies any suspicious food intake but reports eating a hamburger place for lunch a few hours prior to symptom onset. She denies any urinary symptoms. No alleviating factors.  HPI  Past Medical History Past Medical History:  Diagnosis Date   Abscess    Abscess of right genital labia 10/14/2018   ADD (attention deficit disorder)    ADHD    Anxiety    and depression   Aspergilloma ([place]) 09/28/2018   Asthma when she was a baby   Bone spur    Bronchiectasis ([place])    Cholelithiasis 04/01/2017   Cough 10/15/2018   Depression    Environmental allergies    Gallbladder problem    GERD (gastroesophageal reflux disease)    Headache    Heartburn    IBS (irritable bowel syndrome)    Joint pain    Lactose intolerance    Migraine variant    Obesity    PONV (postoperative nausea and vomiting)    with the rhinoplasty surgery   Vitamin D deficiency    Patient Active Problem List   Diagnosis Date Noted   Bipolar 2 disorder, major depressive episode ([place]) 08/09/2020   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   ADHD (attention deficit hyperactivity disorder) 03/05/2020   Migraine without aura and without status migrainosus, not intractable 12/14/2019   Insulin resistance 11/28/2019   Vitamin D deficiency 10/19/2019   Body mass index (BMI) greater than 99th percentile for age in childhood 10/17/2019   Disequilibrium syndrome 07/12/2019   Frequent episodic tension-type headache, not intractable 07/12/2019   Migraine variant with headache 07/12/2019   Hyperuricemia  04/15/2019   Asthma 10/15/2018   Aspergilloma ([place]) 09/28/2018   Pneumonia with the fungal infection aspergillosis ([place]) 07/02/2018   Bronchiectasis without complication ([place]) 97/10/6376   Intermittent hypertension 12/02/2017   Left genital labial abscess 08/06/2017   Enlargement of right atrium 04/22/2017   Hepatic steatosis 04/22/2017   Gastroesophageal reflux disease 01/23/2017   Generalized anxiety disorder 09/10/2016   Obesity with serious comorbidity and body mass index (BMI) greater than 99th percentile for age in pediatric patient 09/10/2016   Binge eating 09/10/2016   Severe obesity due to excess calories with serious comorbidity and body mass index (BMI) greater than 99th percentile for age in pediatric patient ([name]) 09/10/2016   Home Medication(s) Prior to Admission medications   Medication Sig Start Date End Date Taking? Authorizing Provider  cetirizine (ZYRTEC) 10 MG tablet Take 10 mg by mouth daily as needed.   Yes [provider]  fluticasone (FLONASE) 50 MCG/ACT nasal spray Place 2 sprays into both nostrils daily as needed. 01/30/18  Yes [provider]  hydrOXYzine (ATARAX/VISTARIL) 10 MG tablet Take 1 tablet (10 mg total) by mouth 3 (three) times daily as needed. 12/13/20  Yes Eulis Canner E, NP  lamoTRIgine (LAMICTAL) 100 MG tablet Take 1 tablet (100 mg total) by mouth daily. 12/13/20  Yes Eulis Canner E, NP  montelukast (SINGULAIR) 10 MG tablet Take 1 tablet (10 mg total) by mouth at bedtime. 01/11/21 01/11/22 Yes Pat Patrick, MD  NEXIUM 40 MG capsule Take  40 mg by mouth daily.  01/07/17  Yes [provider]  ondansetron (ZOFRAN ODT) 4 MG disintegrating tablet Take 1 tablet (4 mg total) by mouth every 8 (eight) hours as needed for up to 3 days for nausea or vomiting. 03/12/21 03/15/21 Yes Wilborn Membreno, Grayce Sessions, MD  topiramate (TOPAMAX) 25 MG tablet Take 3 1/2 tablets at nighttime 01/24/21  Yes Hickling, Princess Bruins, MD  traZODone  (DESYREL) 50 MG tablet Take 1 tablet (50 mg total) by mouth at bedtime. 12/13/20  Yes Eulis Canner E, NP  albuterol (PROVENTIL HFA;VENTOLIN HFA) 108 (90 BASE) MCG/ACT inhaler Inhale 2 puffs into the lungs every 6 (six) hours as needed for wheezing or shortness of breath.    [provider]  Vitamin D, Ergocalciferol, (DRISDOL) 1.25 MG (50000 UNIT) CAPS capsule Take one tablet every Sunday and take one tablet every Wednesday. Patient not taking: Reported on 03/12/2021 06/27/20   Mellody Dance, DO                                                                                                                                    Past Surgical History Past Surgical History:  Procedure Laterality Date   CHOLECYSTECTOMY N/A 04/01/2017   Procedure: LAPAROSCOPIC CHOLECYSTECTOMY;  Surgeon: Stanford Scotland, MD;  Location: [facility];  Service: General;  Laterality: N/A;   RHINOPLASTY     VIDEO BRONCHOSCOPY WITH ENDOBRONCHIAL NAVIGATION N/A 07/11/2020   Procedure: VIDEO BRONCHOSCOPY WITH ENDOBRONCHIAL NAVIGATION  WITH INDOCYANINE GREEN/METHYLENE BLUE MARKING INJECTION OF RIGHT UPPER LOBE MASS;  Surgeon: Grace Isaac, MD;  Location: [facility];  Service: Thoracic;  Laterality: N/A;   Family History Family History  Problem Relation Age of Onset   Hypertension Mother    Mental illness Mother    Depression Mother    Bipolar disorder Mother    Schizophrenia Mother    Heart disease Mother    Liver disease Mother    Alcohol abuse Mother    Drug abuse Mother    Obesity Mother    Asthma Mother    COPD Maternal Grandmother    Asthma Maternal Grandmother    Schizophrenia Maternal Grandfather    Bipolar disorder Maternal Grandfather    Asthma Maternal Grandfather    Seizures Paternal Grandmother    Atrial fibrillation Paternal Grandmother    Asthma Paternal Grandmother    Diabetes Paternal Grandfather    Congestive Heart Failure Paternal Grandfather    Asthma Paternal Grandfather    High blood  pressure Father    Depression Father    Sleep apnea Father    Obesity Father    Asthma Father     Social History Social History   Tobacco Use   Smoking status: Passive Smoke Exposure - Never Smoker   Smokeless tobacco: Never  Vaping Use   Vaping Use: Never used  Substance Use Topics   Alcohol use: No   Drug use: No  Allergies Penicillins  Review of Systems Review of Systems All other systems are reviewed and are negative for acute change except as noted in the HPI  Physical Exam Vital Signs  I have reviewed the triage vital signs BP (!) 152/117 (BP Location: Left Arm)   Pulse (!) 130   Resp (!) 22   SpO2 96%   Physical Exam Vitals reviewed.  Constitutional:      General: She is not in acute distress.    Appearance: She is well-developed. She is morbidly obese. She is ill-appearing. She is not diaphoretic.  HENT:     Head: Normocephalic and atraumatic.     Comments: Periorbital petechia     Nose: Nose normal.  Eyes:     General: No scleral icterus.       Right eye: No discharge.        Left eye: No discharge.     Conjunctiva/sclera: Conjunctivae normal.     Pupils: Pupils are equal, round, and reactive to light.  Cardiovascular:     Rate and Rhythm: Normal rate and regular rhythm.     Heart sounds: No murmur heard.   No friction rub. No gallop.  Pulmonary:     Effort: Pulmonary effort is normal. No respiratory distress.     Breath sounds: No stridor.  Abdominal:     General: There is no distension.     Palpations: Abdomen is soft.     Tenderness: There is abdominal tenderness in the suprapubic area and left lower quadrant. There is no guarding or rebound.  Musculoskeletal:        General: No tenderness.     Cervical back: Normal range of motion and neck supple.  Skin:    General: Skin is warm and dry.     Findings: No erythema or rash.  Neurological:     Mental Status: She is alert and oriented to person, place, and time.    ED Results and  Treatments Labs (all labs ordered are listed, but only abnormal results are displayed) Labs Reviewed  COMPREHENSIVE METABOLIC PANEL - Abnormal; Notable for the following components:      Result Value   CO2 18 (*)    Glucose, Bld 146 (*)    Total Protein 8.2 (*)    All other components within normal limits  CBC - Abnormal; Notable for the following components:   WBC 15.8 (*)    RBC 5.51 (*)    MCV 78.6 (*)    MCH 24.9 (*)    All other components within normal limits  RESP PANEL BY RT-PCR (FLU A&B, COVID) ARPGX2  LIPASE, BLOOD  I-STAT BETA HCG BLOOD, ED ([facility], [facility], [facility] ONLY)                                                                                                                         EKG  EKG Interpretation  Date/Time:    Ventricular Rate:    PR Interval:  QRS Duration:   QT Interval:    QTC Calculation:   R Axis:     Text Interpretation:          Radiology No results found.  Pertinent labs & imaging results that were available during my care of the patient were reviewed by me and considered in my medical decision making (see chart for details).  Medications Ordered in ED Medications  sodium chloride 0.9 % bolus 1,000 mL (1,000 mLs Intravenous New Bag/Given 03/12/21 9407)    Followed by  0.9 %  sodium chloride infusion (1,000 mLs Intravenous New Bag/Given 03/12/21 0647)  ondansetron (ZOFRAN-ODT) disintegrating tablet 4 mg (4 mg Oral Given 03/12/21 0532)  prochlorperazine (COMPAZINE) injection 10 mg (10 mg Intravenous Given 03/12/21 0642)  diphenhydrAMINE (BENADRYL) injection 12.5 mg (12.5 mg Intravenous Given 03/12/21 0641)                                                                                                                                    Procedures Procedures  (including critical care time)  Medical Decision Making / ED Course I have reviewed the nursing notes for this encounter and the patient's prior records (if available in EHR or on provided  paperwork).   Ashley Cantu was evaluated in Emergency Department on 03/12/2021 for the symptoms described in the history of present illness. She was evaluated in the context of the global COVID-19 pandemic, which necessitated consideration that the patient might be at risk for infection with the SARS-CoV-2 virus that causes COVID-19. Institutional protocols and algorithms that pertain to the evaluation of patients at risk for COVID-19 are in a state of rapid change based on information released by regulatory bodies including the CDC and federal and state organizations. These policies and algorithms were followed during the patient's care in the ED.  20 y.o. female presents with vomiting, headache and abd cramping for several hours. Possible suspicious food intake.  Decreased oral tolerance. Rest of history as above.  Patient appears well, not in distress, and with no signs of toxicity or dehydration. Abdomen with lower discomfort.  Rest of the exam as above  Labs notable for leukocytosis.  Likely demargination from all the emesis.  No significant electrolyte derangements or renal sufficiency.  No evidence of bili obstruction or pancreatitis.  Most consistent with viral gastroenteritis vs food poisoning.   Doubt appendicitis, diverticulitis, severe colitis, dysentery.    Able to tolerate oral intake in the ED.  Discussed symptomatic treatment with the patient and they will follow closely with their PCP.       Final Clinical Impression(s) / ED Diagnoses Final diagnoses:  Viral gastroenteritis      This chart was dictated using voice recognition software.  Despite best efforts to proofread,  errors can occur which can change the documentation meaning.    Fatima Blank, MD 03/12/21 [phone]

## 2021-03-12 NOTE — Progress Notes (Signed)
[place] MD/PA/NP OP Progress Note Virtual Visit via Telephone Note  I connected with Ashley Cantu on 03/12/21 at  4:30 PM EDT by telephone and verified that I am speaking with the correct person using two identifiers.  Location: Patient: home Provider: Clinic   I discussed the limitations, risks, security and privacy concerns of performing an evaluation and management service by telephone and the availability of in person appointments. I also discussed with the patient that there may be a patient responsible charge related to this service. The patient expressed understanding and agreed to proceed.   I provided  30 minutes of non-face-to-face time during this encounter.   03/12/2021 12:38 PM Ashley Cantu  MRN:  [id number]  Chief Complaint: "Everything has gone from bare minimum happy to  being happy daily"  HPI:20 y/o female seen today for follow up psychiatric evaluation. She has a psychiatric history of GAD, ADHD, Bipolar II disorder and OCD. Patient is currently prescribed Lamictal 100 mg daily, trazodone 50mg  nightly, and hydroxyzine 10mg  3 times daily as needed. She reports her medications are effective in managing her psychiatric conditions.  Today patient was unable to login virtually so her exam was done over the phone. During assessment she was  pleasant, cooperative, and engaged in conversation. She informed Probation officer that over all she is doing well. She notes that she has gone from being minimally happy to being happy daily.  Today provider conducted a GAD-7 and the patient scored a 2, at last visit she scored a 5. Provider also conducted a PHQ-9 and the patient scored a 2, at [place] visit she scored a 4.  Today she denies SI/HI/VAH or paranoia. She endorses adequate sleep and appetite.   No medication changes made today.  Patient agreeable to continue medications prescribed. No other concerns noted at this time. Visit Diagnosis:    ICD-10-CM   1. Generalized anxiety disorder  F41.1  hydrOXYzine (ATARAX/VISTARIL) 10 MG tablet    2. Bipolar 2 disorder, major depressive episode (HCC)  F31.81 lamoTRIgine (LAMICTAL) 100 MG tablet    traZODone (DESYREL) 50 MG tablet      Past Psychiatric History: anxiety, depression, ADHD, and OCD  Past Medical History:  Past Medical History:  Diagnosis Date   Abscess    Abscess of right genital labia 10/14/2018   ADD (attention deficit disorder)    ADHD    Anxiety    and depression   Aspergilloma ([place]) 09/28/2018   Asthma when she was a baby   Bone spur    Bronchiectasis ([place])    Cholelithiasis 04/01/2017   Cough 10/15/2018   Depression    Environmental allergies    Gallbladder problem    GERD (gastroesophageal reflux disease)    Headache    Heartburn    IBS (irritable bowel syndrome)    Joint pain    Lactose intolerance    Migraine variant    Obesity    PONV (postoperative nausea and vomiting)    with the rhinoplasty surgery   Vitamin D deficiency     Past Surgical History:  Procedure Laterality Date   CHOLECYSTECTOMY N/A 04/01/2017   Procedure: LAPAROSCOPIC CHOLECYSTECTOMY;  Surgeon: Stanford Scotland, MD;  Location: [facility];  Service: General;  Laterality: N/A;   RHINOPLASTY     VIDEO BRONCHOSCOPY WITH ENDOBRONCHIAL NAVIGATION N/A 07/11/2020   Procedure: VIDEO BRONCHOSCOPY WITH ENDOBRONCHIAL NAVIGATION  WITH INDOCYANINE GREEN/METHYLENE BLUE MARKING INJECTION OF RIGHT UPPER LOBE MASS;  Surgeon: Grace Isaac, MD;  Location: [facility];  Service: Thoracic;  Laterality: N/A;    Family Psychiatric History: Mother Schizoaffective Bipolar type  Family History:  Family History  Problem Relation Age of Onset   Hypertension Mother    Mental illness Mother    Depression Mother    Bipolar disorder Mother    Schizophrenia Mother    Heart disease Mother    Liver disease Mother    Alcohol abuse Mother    Drug abuse Mother    Obesity Mother    Asthma Mother    COPD Maternal Grandmother    Asthma Maternal Grandmother     Schizophrenia Maternal Grandfather    Bipolar disorder Maternal Grandfather    Asthma Maternal Grandfather    Seizures Paternal Grandmother    Atrial fibrillation Paternal Grandmother    Asthma Paternal Grandmother    Diabetes Paternal Grandfather    Congestive Heart Failure Paternal Grandfather    Asthma Paternal Grandfather    High blood pressure Father    Depression Father    Sleep apnea Father    Obesity Father    Asthma Father     Social History:  Social History   Socioeconomic History   Marital status: Single    Spouse name: Not on file   Number of children: Not on file   Years of education: Not on file   Highest education level: Not on file  Occupational History   Occupation: Scientist, water quality  Tobacco Use   Smoking status: Passive Smoke Exposure - Never Smoker   Smokeless tobacco: Never  Vaping Use   Vaping Use: Never used  Substance and Sexual Activity   Alcohol use: No   Drug use: No   Sexual activity: Never    Birth control/protection: Abstinence  Other Topics Concern   Not on file  Social History Narrative   Ashley Cantu received her GED.   She attended [employer].   She lives with her dad only.   She has a younger brother.   She works at [employer].   Social Determinants of Health   Financial Resource Strain: Low Risk    Difficulty of Paying Living Expenses: Not hard at all  Food Insecurity: No Food Insecurity   Worried About Charity fundraiser in the Last Year: Never true   [place] in the Last Year: Never true  Transportation Needs: No Transportation Needs   Lack of Transportation (Medical): No   Lack of Transportation (Non-Medical): No  Physical Activity: Insufficiently Active   Days of Exercise per Week: 1 day   Minutes of Exercise per Session: 60 min  Stress: Stress Concern Present   Feeling of Stress : To some extent  Social Connections: Socially Isolated   Frequency of Communication with Friends and Family: Never   Frequency of Social  Gatherings with Friends and Family: Once a week   Attends Religious Services: Never   Marine scientist or Organizations: No   Attends Archivist Meetings: Never   Marital Status: Never married    Allergies:  Allergies  Allergen Reactions   Penicillins Anaphylaxis, Hives and Swelling    Has patient had a PCN reaction causing immediate rash, facial/tongue/throat swelling, SOB or lightheadedness with hypotension: yes Has patient had a PCN reaction causing severe rash involving mucus membranes or skin necrosis: no Has patient had a PCN reaction that required hospitalization: no Has patient had a PCN reaction occurring within the last 10 years: bo If all of the above answers are "NO", then may proceed  with Cephalosporin use.     Metabolic Disorder Labs: Lab Results  Component Value Date   HGBA1C 5.3 04/24/2020   No results found for: PROLACTIN Lab Results  Component Value Date   CHOL 155 04/24/2020   TRIG 107 (H) 04/24/2020   HDL 45 04/24/2020   CHOLHDL 3.4 04/24/2020   [place] 90 04/24/2020   LDLCALC 78 10/17/2019   Lab Results  Component Value Date   TSH 1.410 04/24/2020   TSH 1.060 10/17/2019    Therapeutic Level Labs: No results found for: LITHIUM No results found for: VALPROATE No components found for:  CBMZ  Current Medications: Current Outpatient Medications  Medication Sig Dispense Refill   albuterol (PROVENTIL HFA;VENTOLIN HFA) 108 (90 BASE) MCG/ACT inhaler Inhale 2 puffs into the lungs every 6 (six) hours as needed for wheezing or shortness of breath.     cetirizine (ZYRTEC) 10 MG tablet Take 10 mg by mouth daily as needed.     fluticasone (FLONASE) 50 MCG/ACT nasal spray Place 2 sprays into both nostrils daily as needed.  5   hydrOXYzine (ATARAX/VISTARIL) 10 MG tablet Take 1 tablet (10 mg total) by mouth 3 (three) times daily as needed. 90 tablet 2   lamoTRIgine (LAMICTAL) 100 MG tablet Take 1 tablet (100 mg total) by mouth daily. 30 tablet 2    montelukast (SINGULAIR) 10 MG tablet Take 1 tablet (10 mg total) by mouth at bedtime. 30 tablet 11   NEXIUM 40 MG capsule Take 40 mg by mouth daily.   1   ondansetron (ZOFRAN ODT) 4 MG disintegrating tablet Take 1 tablet (4 mg total) by mouth every 8 (eight) hours as needed for up to 3 days for nausea or vomiting. 15 tablet 0   topiramate (TOPAMAX) 25 MG tablet Take 3 1/2 tablets at nighttime 110 tablet 5   traZODone (DESYREL) 50 MG tablet Take 1 tablet (50 mg total) by mouth at bedtime. 30 tablet 2   Vitamin D, Ergocalciferol, (DRISDOL) 1.25 MG (50000 UNIT) CAPS capsule Take one tablet every Sunday and take one tablet every Wednesday. (Patient not taking: Reported on 03/12/2021) 8 capsule 0   No current facility-administered medications for this visit.     Musculoskeletal: Strength & Muscle Tone:  Unable to assess due to telephone visit Gait & Station: normal, Unable to assess due to telephone visit Patient leans: N/A  Psychiatric Specialty Exam: Review of Systems  There were no vitals taken for this visit.There is no height or weight on file to calculate BMI.  General Appearance:  Unable to assess due to telephone visit  Eye Contact:   Unable to assess due to telephone visit  Speech:  Clear and Coherent and Normal Rate  Volume:  Normal  Mood:  Euthymic  Affect:  Appropriate and Congruent  Thought Process:  Coherent, Goal Directed and Linear  Orientation:  Full (Time, Place, and Person)  Thought Content: WDL and Logical   Suicidal Thoughts:  No  Homicidal Thoughts:  No  Memory:  Immediate;   Good Recent;   Good Remote;   Good  Judgement:  Good  Insight:  Good  Psychomotor Activity:  Normal  Concentration:  Concentration: Good and Attention Span: Good  Recall:  Good  Fund of Knowledge: Good  Language: Good  Akathisia:  No  Handed:  Right  AIMS (if indicated): Not done  Assets:  Communication Skills Desire for Improvement Financial Resources/Insurance Housing Social  Support  ADL's:  Intact  Cognition: WNL  Sleep:  Good  Screenings: GAD-7    Flowsheet Row Video Visit from 03/12/2021 in [facility] Video Visit from 12/13/2020 in [facility] Counselor from 10/30/2020 in [place] from 10/01/2020 in [place] from 08/28/2020 in [facility]  Total GAD-7 Score 2 5 3 7 4       PHQ2-9    Flowsheet Row Video Visit from 03/12/2021 in [facility] Video Visit from 12/13/2020 in [facility] Counselor from 10/30/2020 in [place] from 10/01/2020 in [place] from 08/28/2020 in [place]  PHQ-2 Total Score 1 2 2 3 1   PHQ-9 Total Score 2 4 5 7 6       Flowsheet Row Video Visit from 03/12/2021 in [facility] Most recent reading at 03/12/2021 12:10 PM ED from 03/12/2021 in [place] DEPT Most recent reading at 03/12/2021  5:36 AM Video Visit from 12/13/2020 in [facility] Most recent reading at 12/13/2020  3:41 PM  C-SSRS RISK CATEGORY No Risk No Risk No Risk        Assessment and Plan: Patient notes that she is doing well on her current medication regimen. No medication changes made today.  Patient agreeable to continue medications prescribed. 1. Generalized anxiety disorder  Continue- hydrOXYzine (ATARAX/VISTARIL) 10 MG tablet; Take 1 tablet (10 mg total) by mouth 3 (three) times daily as needed.  Dispense: 90 tablet; Refill: 2  2. Bipolar 2 disorder, major depressive episode (HCC) Continue- lamoTRIgine (LAMICTAL) 100 MG tablet; Take 1 tablet (100 mg total) by mouth daily.  Dispense: 30 tablet; Refill: 2 Continue- traZODone  (DESYREL) 50 MG tablet; Take 1 tablet (50 mg total) by mouth at bedtime.  Dispense: 30 tablet; Refill: 2    Follow-up in 3 month Follow-up with therapy  Salley Slaughter, NP 03/12/2021, 12:38 PM

## 2021-03-12 NOTE — ED Triage Notes (Addendum)
Pt vomiting since about 2p. Also reports a headache. Has vomited about 30 times. Brother has similar symptoms.

## 2021-03-12 NOTE — ED Notes (Signed)
Pt tolerated oral fluids with no nausea or vomiting.

## 2021-04-26 LAB — ORGANISM IDENTIFICATION, MOLD

## 2021-05-28 ENCOUNTER — Encounter (INDEPENDENT_AMBULATORY_CARE_PROVIDER_SITE_OTHER): Payer: Self-pay | Admitting: Pediatrics

## 2021-05-28 ENCOUNTER — Other Ambulatory Visit: Payer: Self-pay

## 2021-05-28 ENCOUNTER — Ambulatory Visit (INDEPENDENT_AMBULATORY_CARE_PROVIDER_SITE_OTHER): Payer: Medicaid Other | Admitting: Pediatrics

## 2021-05-28 VITALS — BP 120/72 | HR 80 | Ht 67.84 in | Wt >= 6400 oz

## 2021-05-28 DIAGNOSIS — Z68.41 Body mass index (BMI) pediatric, greater than or equal to 95th percentile for age: Secondary | ICD-10-CM

## 2021-05-28 DIAGNOSIS — G43009 Migraine without aura, not intractable, without status migrainosus: Secondary | ICD-10-CM

## 2021-05-28 DIAGNOSIS — G43809 Other migraine, not intractable, without status migrainosus: Secondary | ICD-10-CM

## 2021-05-28 MED ORDER — TOPIRAMATE 25 MG PO TABS
ORAL_TABLET | ORAL | 5 refills | Status: DC
Start: 2021-05-28 — End: 2021-06-17

## 2021-05-28 NOTE — Progress Notes (Signed)
Patient: Ashley Cantu MRN: [id number] Sex: female DOB: 08-07-2001  Provider: Wyline Copas, MD Location of Care: [facility] Child Neurology  Note type: Routine return visit  History of Present Illness: Referral Source: Casilda Carls, MD History from: patient and [facility] chart Chief Complaint: Migraine variant with headache  Ashley Cantu is a 20 y.o. female who was evaluated May 28, 2021 for the first time since Jan 24, 2021.  She has migraine variant associated with disequilibrium and nausea and migraine without aura with headaches that are frontally predominant and also at the vertex associated with nausea without vomiting and feeling of near syncope associated with dizziness.  She denies sensitivity to light and sound.  She is incapacitated by pain.  She has not experienced any migraines over the last 4 months.  We had increased topiramate to 3-1/2 tablets.  I am not certain why she felt that it caused blurred vision and cut back to 3 tablets it has not made a difference.  Her general health is good other than her morbid obesity but she is lost 1.4 pounds since her last visit which is a good thing.  She has not contracted COVID.  She has had 2 vaccines and a booster.  Her sleep hygiene has improved.  She gets out of work around [employer] and is in bed by 1-1 30.  She sleeps until 9:52 AM.  She often has to be at work sometime between 3 and 5 PM.  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  Her workweek has decreased over time now to 15 to 20hours/week.  She sees both a Engineer, water and psychiatrist [place].  She will see my colleague Dr. Sarina Ill on 26 September.  She had planned to travel to [place] to attend [employer] she does not want to be that far away from her father.  She is looking at schools that are closer but has not made any decisions.  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  Review of Systems: A complete review of systems  was assessed and was negative except as noted above and below.  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    Birth History 8 lbs. 0 oz. infant born at [redacted] weeks gestational age to a g 1 p 0 female. Gestation was uncomplicated Mother received Epidural anesthesia  Primary cesarean section Nursery Course was uncomplicated, breast-fed 2 to 3 months Growth and Development was recalled as  normal   Behavior History Generalized anxiety disorder  Surgical History Procedure Laterality Date   CHOLECYSTECTOMY N/A 04/01/2017   Procedure: LAPAROSCOPIC CHOLECYSTECTOMY;  Surgeon: Stanford Scotland, MD;  Location: [place];  Service: General;  Laterality: N/A;   RHINOPLASTY     VIDEO BRONCHOSCOPY WITH ENDOBRONCHIAL NAVIGATION N/A 07/11/2020   Procedure: VIDEO BRONCHOSCOPY WITH ENDOBRONCHIAL NAVIGATION  WITH INDOCYANINE GREEN/METHYLENE BLUE MARKING INJECTION OF RIGHT UPPER LOBE MASS;  Surgeon: Grace Isaac, MD;  Location: [place];  Service: Thoracic;  Laterality: N/A;   Family History family history includes Alcohol abuse in her mother; Asthma in her father, maternal grandfather, maternal grandmother, mother, paternal grandfather, and paternal grandmother; Atrial fibrillation in her paternal  grandmother; Bipolar disorder in her maternal grandfather and mother; COPD in her maternal grandmother; Congestive Heart Failure in her paternal grandfather; Depression in her father and mother; Diabetes in her paternal grandfather; Drug abuse in her mother; Heart disease in her mother; High blood pressure in her father; Hypertension in her mother; Liver disease in her mother; Mental illness in her mother; Obesity in her father and mother; Schizophrenia in her maternal grandfather and mother; Seizures in her paternal grandmother; Sleep apnea in her father. Family history is negative for migraines, intellectual disabilities, blindness, deafness, birth defects, chromosomal disorder, or autism.  Social History Socioeconomic History   Marital status: Single   Years of education: 13   Highest education level: GED  Occupational History   Occupation: Scientist, water quality at a [employer]  Tobacco Use   Smoking status: Passive Smoke Exposure - Never Smoker   Smokeless tobacco: Never  Vaping Use   Vaping Use: Never used  Substance and Sexual Activity   Alcohol use: No   Drug use: No   Sexual activity: Never    Birth control/protection: Abstinence  Social History Narrative   Yuleni received her GED.   She attended [employer].   She lives with her dad only.   She has a younger brother.   Social Determinants of Health    Financial Resource Strain: Low Risk    Difficulty of Paying Living Expenses: Not hard at all  Food Insecurity: No Food Insecurity   Worried About Charity fundraiser in the Last Year: Never true   [place] in the Last Year: Never true  Transportation Needs: No Transportation Needs   Lack of Transportation (Medical): No   Lack of Transportation (Non-Medical): No  Physical Activity: Insufficiently Active   Days of Exercise per Week: 1 day   Minutes of Exercise per Session: 60 min  Stress: Stress Concern Present   Feeling of Stress : To some extent  Social Connections: Socially  Isolated   Frequency of Communication with Friends and Family: Never   Frequency of Social Gatherings with Friends and Family: Once a week   Attends Religious Services: Never   Marine scientist or Organizations: No   Attends Music therapist: Never   Marital Status: Never married   Allergies Allergen Reactions   Penicillins Anaphylaxis, Hives and Swelling    Has patient had a PCN reaction causing immediate rash, facial/tongue/throat swelling, SOB or lightheadedness with hypotension: yes Has patient had a PCN reaction causing severe rash involving mucus membranes or skin necrosis: no Has patient had a PCN reaction that required hospitalization: no Has patient had a PCN reaction occurring within the last 10 years: bo If all of the above answers are "NO", then may proceed with Cephalosporin use.    Physical Exam BP 120/72   Pulse 80   Ht 5' 7.84" (1.723 m)   Wt (!) 464 lb 9.6 oz (210.7 kg)  BMI 70.99 kg/m   General: alert, well developed, obese, in no acute distress, sandy hair, brown eyes,  even- handed Head: normocephalic, no dysmorphic features Ears, Nose and Throat: Otoscopic: tympanic membranes normal; pharynx: oropharynx is pink without exudates or tonsillar hypertrophy Neck: supple, full range of motion, no cranial or cervical bruits Respiratory: auscultation clear Cardiovascular: no murmurs, pulses are normal Musculoskeletal: no skeletal deformities or apparent scoliosis Skin: no rashes or neurocutaneous lesions  Neurologic Exam  Mental Status: alert; oriented to person, place and year; knowledge is normal for age; language is normal Cranial Nerves: visual fields are full to double simultaneous stimuli; extraocular movements are full and conjugate; pupils are round reactive to light; funduscopic examination shows Bosserman disc margins with normal vessels; symmetric facial strength; midline tongue and uvula; air conduction is greater than bone conduction  bilaterally Motor: Normal strength, tone and mass; good fine motor movements; no pronator drift Sensory: intact responses to cold, vibration, proprioception and stereognosis Coordination: good finger-to-nose, rapid repetitive alternating movements and finger apposition Gait and Station: normal gait and station: patient is able to walk on heels, toes and tandem without difficulty; balance is adequate; Romberg exam is negative; Gower response is negative Reflexes: symmetric and diminished bilaterally; no clonus; bilateral flexor plantar responses   Assessment 1.  Migraine variant with headache, G43.809. 2.  Migraine without aura without status migrainosus, not intractable, G43.009. 3.  Morbid obesity, E66.01, Z68.54  Discussion I am very pleased that Ashley Cantu is not experiencing migraines on her current dose of topiramate.  I am also happy that she lost 1.4 pounds over the past 4 months.  All that said not a lot, she did not gain weight which is good.  She is doing very well as far as sleep hygiene and hydration.  She asked about the interaction between lamotrigine and topiramate and though there is 1, she seems to be tolerating both and I would not change her medicines given for her general anxiety.  It is not something that I expect Dr. Jaynee Eagles to treat.  I recommended that she look not only at [facility] at [place] when she transfers care from her pediatrician.  Plan A prescription was refilled for topiramate 3 tablets at nighttime with 6 months of refill.  She will see Dr. Lavell Anchors at the end of this month.  I expect her to consider whether or not topiramate needs to be continued I can say that we have tried to decrease it in the past, headaches have returned.  Greater than 50% of the 20-minute visit was spent in counseling and coordination of care concerning her headache disorder and briefly discussing her weight.   Medication List    Accurate as of  May 28, 2021  2:11 PM. If you have any questions, ask your nurse or doctor.     albuterol 108 (90 Base) MCG/ACT inhaler Commonly known as: VENTOLIN HFA Inhale 2 puffs into the lungs every 6 (six) hours as needed for wheezing or shortness of breath.   cetirizine 10 MG tablet Commonly known as: ZYRTEC Take 10 mg by mouth daily as needed.   fluticasone 50 MCG/ACT nasal spray Commonly known as: FLONASE Place 2 sprays into both nostrils daily as needed.   hydrOXYzine 10 MG tablet Commonly known as: ATARAX/VISTARIL Take 1 tablet (10 mg total) by mouth 3 (three) times daily as needed.   lamoTRIgine 100 MG tablet Commonly known as: LaMICtal Take 1 tablet (100 mg total) by mouth daily.  montelukast 10 MG tablet Commonly known as: SINGULAIR Take 1 tablet (10 mg total) by mouth at bedtime.   NexIUM 40 MG capsule Generic drug: esomeprazole Take 40 mg by mouth daily.   topiramate 25 MG tablet Commonly known as: TOPAMAX Take 3 tablets at nighttime What changed: additional instructions Changed by: Wyline Copas, MD   traZODone 50 MG tablet Commonly known as: DESYREL Take 1 tablet (50 mg total) by mouth at bedtime.     The medication list was reviewed and reconciled. All changes or newly prescribed medications were explained.  A complete medication list was provided to the patient/caregiver.  Jodi Geralds MD

## 2021-05-28 NOTE — Patient Instructions (Signed)
It was a pleasure to see you today.  I am glad that you are not having any headaches.  You have an appointment with Dr. Jaynee Eagles on September 26.  I am not going to make any change in topiramate at this time and refilled your prescription.  As I mentioned to you I think that you should look at [facility] as well as [facility] I can recommend both groups.  There is anything to do to help you between now and 29 September, my last day, please give me a call.  I have enjoyed providing care to you.  I think that you will like Dr. Jaynee Eagles.

## 2021-06-12 ENCOUNTER — Other Ambulatory Visit: Payer: Self-pay

## 2021-06-12 ENCOUNTER — Telehealth (INDEPENDENT_AMBULATORY_CARE_PROVIDER_SITE_OTHER): Payer: Medicaid Other | Admitting: Psychiatry

## 2021-06-12 ENCOUNTER — Encounter (HOSPITAL_COMMUNITY): Payer: Self-pay | Admitting: Psychiatry

## 2021-06-12 DIAGNOSIS — F3181 Bipolar II disorder: Secondary | ICD-10-CM

## 2021-06-12 DIAGNOSIS — F411 Generalized anxiety disorder: Secondary | ICD-10-CM

## 2021-06-12 MED ORDER — TRAZODONE HCL 50 MG PO TABS: 50.0000 mg | ORAL_TABLET | Freq: Every day | ORAL | 3 refills | Status: DC

## 2021-06-12 MED ORDER — HYDROXYZINE HCL 10 MG PO TABS: 10.0000 mg | ORAL_TABLET | Freq: Three times a day (TID) | ORAL | 3 refills | Status: DC | PRN

## 2021-06-12 MED ORDER — LAMOTRIGINE 100 MG PO TABS: 100.0000 mg | ORAL_TABLET | Freq: Every day | ORAL | 3 refills | Status: DC

## 2021-06-12 NOTE — Progress Notes (Signed)
[place] MD/PA/NP OP Progress Note Virtual Visit via Telephone Note  I connected with Ashley Cantu on 06/12/21 at  2:30 PM EDT by telephone and verified that I am speaking with the correct person using two identifiers.  Location: Patient: home Provider: Clinic   I discussed the limitations, risks, security and privacy concerns of performing an evaluation and management service by telephone and the availability of in person appointments. I also discussed with the patient that there may be a patient responsible charge related to this service. The patient expressed understanding and agreed to proceed.   I provided  30 minutes of non-face-to-face time during this encounter.   06/12/2021 1:24 PM Ashley Cantu  MRN:  [id number]  Chief Complaint: "Im doing fine"  HPI:20 y/o female seen today for follow up psychiatric evaluation. She has a psychiatric history of GAD, ADHD, Bipolar II disorder and OCD. Patient is currently prescribed Lamictal 100 mg daily, trazodone 50mg  nightly, and hydroxyzine 10mg  3 times daily as needed. She reports her medications are effective in managing her psychiatric conditions.  Today patient was unable to login virtually so her exam was done over the phone. During assessment she was  pleasant, cooperative, and engaged in conversation. She informed Probation officer that she has been doing fine. She notes that her medications are effective and notes that he has not been any new changes in her life.  She inform her that her mood is stable and reports that she has very minimal anxiety and depression.   Today provider conducted a GAD-7 and the patient scored a 1, at last visit she scored a 2. Provider also conducted a PHQ-9 and the patient scored a 3, at [place] visit she scored a 2.  Today she denies SI/HI/VAH or paranoia. She endorses adequate sleep and appetite.   No medication changes made today.  Patient agreeable to continue medications prescribed. No other concerns noted at this  time. Visit Diagnosis:    ICD-10-CM   1. Bipolar 2 disorder, major depressive episode (HCC)  F31.81 lamoTRIgine (LAMICTAL) 100 MG tablet    traZODone (DESYREL) 50 MG tablet    2. 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   Asthma when she was a baby   Bone spur    Bronchiectasis ([place])    Cholelithiasis 04/01/2017   Cough 10/15/2018   Depression    Environmental allergies    Gallbladder problem    GERD (gastroesophageal reflux disease)    Headache    Heartburn    IBS (irritable bowel syndrome)    Joint pain    Lactose intolerance    Migraine variant    Obesity    PONV (postoperative nausea and vomiting)    with the rhinoplasty surgery   Vitamin D deficiency     Past Surgical History:  Procedure Laterality Date   CHOLECYSTECTOMY N/A 04/01/2017   Procedure: LAPAROSCOPIC CHOLECYSTECTOMY;  Surgeon: Stanford Scotland, MD;  Location: [place];  Service: General;  Laterality: N/A;   RHINOPLASTY     VIDEO BRONCHOSCOPY WITH ENDOBRONCHIAL NAVIGATION N/A 07/11/2020   Procedure: VIDEO BRONCHOSCOPY WITH ENDOBRONCHIAL NAVIGATION  WITH INDOCYANINE GREEN/METHYLENE BLUE MARKING INJECTION  OF RIGHT UPPER LOBE MASS;  Surgeon: Grace Isaac, MD;  Location: [place];  Service: Thoracic;  Laterality: N/A;    Family Psychiatric History: Mother Schizoaffective Bipolar type  Family History:  Family History  Problem Relation Age of Onset   Hypertension Mother    Mental illness Mother    Depression Mother    Bipolar disorder Mother    Schizophrenia Mother    Heart disease Mother    Liver disease Mother    Alcohol abuse Mother    Drug abuse Mother    Obesity Mother     Asthma Mother    COPD Maternal Grandmother    Asthma Maternal Grandmother    Schizophrenia Maternal Grandfather    Bipolar disorder Maternal Grandfather    Asthma Maternal Grandfather    Seizures Paternal Grandmother    Atrial fibrillation Paternal Grandmother    Asthma Paternal Grandmother    Diabetes Paternal Grandfather    Congestive Heart Failure Paternal Grandfather    Asthma Paternal Grandfather    High blood pressure Father    Depression Father    Sleep apnea Father    Obesity Father    Asthma Father     Social History:  Social History   Socioeconomic History   Marital status: Single    Spouse name: Not on file   Number of children: Not on file   Years of education: Not on file   Highest education level: Not on file  Occupational History   Occupation: Scientist, water quality  Tobacco Use   Smoking status: Never    Passive exposure: Yes   Smokeless tobacco: Never  Vaping Use   Vaping Use: Never used  Substance and Sexual Activity   Alcohol use: No   Drug use: No   Sexual activity: Never    Birth control/protection: Abstinence  Other Topics Concern   Not on file  Social History Narrative   [place] received her GED.   She attended [employer].   She lives with her dad only.   She has a younger brother.   She works at [employer].   Social Determinants of Health   Financial Resource Strain: Not on file  Food Insecurity: Not on file  Transportation Needs: Not on file  Physical Activity: Not on file  Stress: Not on file  Social Connections: Not on file    Allergies:  Allergies  Allergen Reactions   Penicillins Anaphylaxis, Hives and Swelling    Has patient had a PCN reaction causing immediate rash, facial/tongue/throat swelling, SOB or lightheadedness with hypotension: yes Has patient had a PCN reaction causing severe rash involving mucus membranes or skin necrosis: no Has patient had a PCN reaction that required hospitalization: no Has patient had a PCN reaction  occurring within the last 10 years: bo If all of the above answers are "NO", then may proceed with Cephalosporin use.     Metabolic Disorder Labs: Lab Results  Component Value Date   HGBA1C 5.3 04/24/2020   No results found for: PROLACTIN Lab Results  Component Value Date   CHOL 155 04/24/2020   TRIG 107 (H) 04/24/2020   HDL 45 04/24/2020   CHOLHDL 3.4 04/24/2020   [place] 90 04/24/2020   [place] 78 10/17/2019   Lab Results  Component Value Date   TSH 1.410 04/24/2020   TSH 1.060 10/17/2019    Therapeutic Level Labs: No results found for: LITHIUM No results found for: VALPROATE No components found for:  CBMZ  Current Medications: Current Outpatient Medications  Medication Sig Dispense Refill   albuterol (PROVENTIL HFA;VENTOLIN HFA) 108 (90 BASE) MCG/ACT inhaler Inhale 2 puffs into the lungs every 6 (six) hours as needed for wheezing or shortness of breath.     cetirizine (ZYRTEC) 10 MG tablet Take 10 mg by mouth daily as needed.     fluticasone (FLONASE) 50 MCG/ACT nasal spray Place 2 sprays into both nostrils daily as needed.  5   hydrOXYzine (ATARAX/VISTARIL) 10 MG tablet Take 1 tablet (10 mg total) by mouth 3 (three) times daily as needed. 90 tablet 3   lamoTRIgine (LAMICTAL) 100 MG tablet Take 1 tablet (100 mg total) by mouth daily. 30 tablet 3   montelukast (SINGULAIR) 10 MG tablet Take 1 tablet (10 mg total) by mouth at bedtime. 30 tablet 11   NEXIUM 40 MG capsule Take 40 mg by mouth daily.   1   topiramate (TOPAMAX) 25 MG tablet Take 3 tablets at nighttime 93 tablet 5   traZODone (DESYREL) 50 MG tablet Take 1 tablet (50 mg total) by mouth at bedtime. 30 tablet 3   No current facility-administered medications for this visit.     Musculoskeletal: Strength & Muscle Tone:  Unable to assess due to telephone visit Gait & Station: normal, Unable to assess due to telephone visit Patient leans: N/A  Psychiatric Specialty Exam: Review of Systems  There were no  vitals taken for this visit.There is no height or weight on file to calculate BMI.  General Appearance:  Unable to assess due to telephone visit  Eye Contact:   Unable to assess due to telephone visit  Speech:  Clear and Coherent and Normal Rate  Volume:  Normal  Mood:  Euthymic  Affect:  Appropriate and Congruent  Thought Process:  Coherent, Goal Directed and Linear  Orientation:  Full (Time, Place, and Person)  Thought Content: WDL and Logical   Suicidal Thoughts:  No  Homicidal Thoughts:  No  Memory:  Immediate;   Good Recent;   Good Remote;   Good  Judgement:  Good  Insight:  Good  Psychomotor Activity:  Normal  Concentration:  Concentration: Good and Attention Span: Good  Recall:  Good  Fund of Knowledge: Good  Language: Good  Akathisia:  No  Handed:  Right  AIMS (if indicated): Not done  Assets:  Communication Skills Desire for Improvement Financial Resources/Insurance Housing Social Support  ADL's:  Intact  Cognition: WNL  Sleep:  Good   Screenings: GAD-7    Flowsheet Row Video Visit from [facility]/12/2021 in [facility] Video Visit from 03/12/2021 in [facility] Video Visit from 12/13/2020 in [facility] Counselor from 10/30/2020 in [place] from 10/01/2020 in [facility]  Total GAD-7 Score 1 2 5 3 7       PHQ2-9    Flowsheet Row Video Visit from 06/12/2021 in [facility] Video Visit from 03/12/2021 in [facility] Video Visit from 12/13/2020 in [facility] Counselor from 10/30/2020 in [place] from 10/01/2020 in [place]  PHQ-2 Total Score 1 1 2 2 3   PHQ-9 Total Score 3 2 4 5 7       Flowsheet Row Video Visit from 03/12/2021 in [facility] Most recent reading at 03/12/2021 12:10 PM ED from 03/12/2021 in [facility] Most recent reading  at 03/12/2021  5:36 AM Video Visit from 12/13/2020 in [facility] Most recent reading at 12/13/2020  3:41 PM  C-SSRS RISK CATEGORY No Risk No Risk No Risk        Assessment and Plan: Patient notes that she is doing well on her current medication regimen. No medication changes made today.  Patient agreeable to continue medications prescribed. 1. Generalized anxiety disorder  Continue- hydrOXYzine (ATARAX/VISTARIL) 10 MG tablet; Take 1 tablet (10 mg total) by mouth 3 (three) times daily as needed.  Dispense: 90 tablet; Refill: 3  2. Bipolar 2 disorder, major depressive episode (HCC) Continue- lamoTRIgine (LAMICTAL) 100 MG tablet; Take 1 tablet (100 mg total) by mouth daily.  Dispense: 30 tablet; Refill: 3 Continue- traZODone (DESYREL) 50 MG tablet; Take 1 tablet (50 mg total) by mouth at bedtime.  Dispense: 30 tablet; Refill: 3    Follow-up in 3 month Follow-up with therapy  Salley Slaughter, NP 06/12/2021, 1:24 PM

## 2021-06-17 ENCOUNTER — Other Ambulatory Visit: Payer: Self-pay

## 2021-06-17 ENCOUNTER — Ambulatory Visit: Payer: Medicaid Other | Admitting: Neurology

## 2021-06-17 ENCOUNTER — Encounter: Payer: Self-pay | Admitting: Neurology

## 2021-06-17 DIAGNOSIS — G43009 Migraine without aura, not intractable, without status migrainosus: Secondary | ICD-10-CM | POA: Diagnosis not present

## 2021-06-17 MED ORDER — TOPIRAMATE 25 MG PO TABS: 25.0000 mg | ORAL_TABLET | Freq: Two times a day (BID) | ORAL | 4 refills | Status: DC

## 2021-06-17 NOTE — Patient Instructions (Signed)
Continue current medications, discussed teratogenicity of topiramate do not get pregnant while on it.

## 2021-06-17 NOTE — Progress Notes (Signed)
[facility]    Provider:  Dr Jaynee Cantu Requesting Provider: Jodi Geralds, MD Primary Care Provider:  [name], MD  CC:  migraines  HPI:  Ashley Cantu is a 20 y.o. female here as requested by Ashley Geralds, MD for migraines, transfer of care.  Past medical history obesity, ADHD, anxiety, asthma, depression, morbid obesity, IBS, migraine variant, migraine without aura, bipolar 2.  I reviewed Ashley Cantu notes and pediatric neurology, she has migraine associated with disequilibrium and nausea and also migraine without aura with headaches that are frontal and vertex associated with nausea without vomiting and a feeling of near syncope associated with dizziness.  She was started on Topamax and Lamictal and she had not experienced any headaches or other symptoms reported 01/24/2021.  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   She doesn't really have migraines anymore, feeling well, she may have a sinus headache every once ina  while or a headache that last 1 minutes.  Migraines started in 2019 and it was so bad she couldn't function esp. With the vestibular symptoms. But since being on the medications doing really. Here for transfer of care. No vision changes, no numbness or tingling. 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  When she was initially seen by Ashley. Gaynell Cantu for headaches and migraines it started about that time, she was initially placed on topiramate, she did not think it was working but on more than one occasion try to stop it and her headaches quickly worsened, she was having 1-2 headaches per week, majority appearing to be brief lasting 5 to 10 minutes, involving the frontotemporal regions with more pressure-like pain, she denied sensitivity to light but did endorse  sensitivity to sound, she denied nausea and vomiting, the headaches would last all day and be moderate in intensity.  But since staying on the Topamax patient feels fine, she denies any significant headaches now, or any associated symptoms, or inciting events.No other focal neurologic deficits, associated symptoms, inciting events or modifiable factors.  Reviewed notes, labs and imaging from outside physicians, which showed: see above  BUN/Creat 18/0.72 03/12/2021 Review of Systems: Patient complains of symptoms per HPI as well as the following symptoms obesity. Pertinent negatives and positives per HPI. All others negative.   Social History   Socioeconomic History   Marital status: Single    Spouse name: Not on file   Number of children: Not on file   Years of education: Not on file   Highest education level: Not on file  Occupational History   Occupation: Scientist, water quality  Tobacco Use   Smoking status: Never    Passive exposure: Yes   Smokeless tobacco: Never  Vaping Use   Vaping Use: Never used  Substance and Sexual Activity   Alcohol use: No   Drug use: No   Sexual activity: Never    Birth control/protection: Abstinence  Other Topics Concern   Not on file  Social History Narrative   Ashley Cantu received her GED.   She attended [employer].   She lives with her dad only.   She has a younger brother.   She works at [employer].   Social Determinants of Health   Financial Resource Strain: Not on file  Food Insecurity: Not on file  Transportation Needs: Not on file  Physical Activity: Not on file  Stress: Not on file  Social Connections: Not on file  Intimate Partner Violence: Not on file    Family History  Problem Relation Age of Onset   Hypertension Mother    Mental illness Mother    Depression Mother    Bipolar disorder Mother    Schizophrenia Mother    Heart disease Mother    Liver disease Mother    Alcohol abuse Mother    Drug abuse Mother    Obesity Mother     Asthma Mother    Migraines Mother    High blood pressure Father    Depression Father    Sleep apnea Father    Obesity Father    Asthma Father    Migraines Father    COPD Maternal Grandmother    Asthma Maternal Grandmother    Schizophrenia Maternal Grandfather    Bipolar disorder Maternal Grandfather    Asthma Maternal Grandfather    Seizures Paternal Grandmother    Atrial fibrillation Paternal Grandmother    Asthma Paternal Grandmother    Diabetes Paternal Grandfather    Congestive Heart Failure Paternal Grandfather    Asthma Paternal Grandfather     Past Medical History:  Diagnosis Date   Abscess    Abscess of right genital labia 10/14/2018   ADD (attention deficit disorder)    ADHD    Anxiety    and depression   Aspergilloma ([place]) 09/28/2018   Asthma when she was a baby   Bone spur    Bronchiectasis ([place])    Cholelithiasis 04/01/2017   Cough 10/15/2018   Depression    Environmental allergies    Gallbladder problem    GERD (gastroesophageal reflux disease)    Headache    Heartburn    IBS (irritable bowel syndrome)    Joint pain    Lactose intolerance    Migraine variant    Obesity    PONV (postoperative nausea and vomiting)    with the rhinoplasty surgery   Vitamin D deficiency     Patient Active Problem List   Diagnosis Date Noted   Bipolar 2 disorder, major depressive episode ([place]) 08/09/2020   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   ADHD (attention deficit hyperactivity disorder) 03/05/2020   Migraine without aura and without status migrainosus, not intractable 12/14/2019   Insulin resistance 11/28/2019   Vitamin D deficiency 10/19/2019   Body mass index (BMI) greater than 99th percentile for age in childhood 10/17/2019   Disequilibrium syndrome 07/12/2019   Frequent episodic tension-type headache, not intractable 07/12/2019   Migraine variant with headache 07/12/2019   Hyperuricemia 04/15/2019   Asthma 10/15/2018   Aspergilloma ([place]) 09/28/2018   Pneumonia  with the fungal infection aspergillosis ([place]) 07/02/2018   Bronchiectasis without complication ([place]) 44/81/8563   Intermittent hypertension 12/02/2017   Cantu genital labial abscess 08/06/2017   Enlargement of right atrium 04/22/2017   Hepatic steatosis 04/22/2017   Gastroesophageal reflux disease 01/23/2017   Generalized anxiety disorder 09/10/2016   Obesity with serious comorbidity and body mass index (BMI) greater than 99th percentile for age in pediatric patient 09/10/2016   Binge eating 09/10/2016   Severe obesity due to excess calories with serious comorbidity and body mass index (BMI) greater than 99th percentile for age in pediatric patient ([place]) 09/10/2016    Past Surgical History:  Procedure Laterality Date   CHOLECYSTECTOMY N/A 04/01/2017   Procedure: LAPAROSCOPIC CHOLECYSTECTOMY;  Surgeon: Stanford Scotland, MD;  Location: [facility];  Service: General;  Laterality: N/A;   RHINOPLASTY     VIDEO BRONCHOSCOPY WITH ENDOBRONCHIAL NAVIGATION N/A 07/11/2020   Procedure: VIDEO BRONCHOSCOPY WITH ENDOBRONCHIAL NAVIGATION  WITH INDOCYANINE GREEN/METHYLENE BLUE MARKING INJECTION OF RIGHT UPPER LOBE MASS;  Surgeon: Grace Isaac, MD;  Location: [facility];  Service: Thoracic;  Laterality: N/A;    Current Outpatient Medications  Medication Sig Dispense Refill   albuterol (PROVENTIL HFA;VENTOLIN HFA) 108 (90 BASE) MCG/ACT inhaler Inhale 2 puffs into the lungs every 6 (six) hours as needed for wheezing or shortness of breath.     cetirizine (ZYRTEC) 10 MG tablet Take 10 mg by mouth daily as needed.     fluticasone (FLONASE) 50 MCG/ACT nasal spray Place 2 sprays into both nostrils daily as needed.  5   hydrOXYzine (ATARAX/VISTARIL) 10 MG tablet Take 1 tablet (10 mg total) by mouth 3 (three) times daily as needed. 90 tablet 3   lamoTRIgine (LAMICTAL) 100 MG tablet Take 1 tablet (100 mg total) by mouth daily. 30 tablet 3   montelukast (SINGULAIR) 10 MG tablet Take 1 tablet (10 mg total) by mouth at  bedtime. 30 tablet 11   NEXIUM 40 MG capsule Take 40 mg by mouth daily.   1   traZODone (DESYREL) 50 MG tablet Take 1 tablet (50 mg total) by mouth at bedtime. 30 tablet 3   topiramate (TOPAMAX) 25 MG tablet Take 1 tablet (25 mg total) by mouth 2 (two) times daily. 180 tablet 4   No current facility-administered medications for this visit.    Allergies as of 06/17/2021 - Review Complete 06/17/2021  Allergen Reaction Noted   Penicillins Anaphylaxis, Hives, and Swelling 02/13/2012    Vitals: BP 117/75   Pulse 83   Ht 5\' 7"  (1.702 m)   Wt (!) 468 lb 12.8 oz (212.6 kg)   BMI 73.42 kg/m  Last Weight:  Wt Readings from Last 1 Encounters:  06/17/21 (!) 468 lb 12.8 oz (212.6 kg) (>99 %, Z= 3.22)*   * Growth percentiles are based on CDC (Girls, 2-20 Years) data.   Last Height:   Ht Readings from Last 1 Encounters:  06/17/21 5\' 7"  (1.702 m) (86 %, Z= 1.07)*   * Growth percentiles are based on CDC (Girls, 2-20 Years) data.     Physical exam: Exam: Gen: NAD, conversant, well nourised, obese, well groomed                     CV: RRR, no MRG. No Carotid Bruits. No peripheral edema, warm, nontender Eyes: Conjunctivae clear without exudates or hemorrhage  Neuro: Detailed Neurologic Exam  Speech:    Speech is normal; fluent and spontaneous with normal comprehension.  Cognition:    The patient is oriented to person, place, and time;     recent and remote memory intact;     language fluent;     normal attention, concentration,     fund of knowledge Cranial Nerves:    The pupils are equal, round, and reactive to light. The fundi are flat. Visual fields are full to finger confrontation. Extraocular movements are intact. Trigeminal sensation is intact and the muscles of mastication are normal. The Cantu is symmetric. The palate elevates in the midline. Hearing intact. Voice is normal. Shoulder shrug is normal. The tongue has normal motion without fasciculations.   Coordination:     Normal  Gait:    Slightly wide-based due to extremely large body habitus  Motor Observation:    No asymmetry, no atrophy, and  no involuntary movements noted. Tone:    Normal muscle tone.    Posture:    Posture is normal. normal erect    Strength:    Strength is V/V in the upper and lower limbs.      Sensation: intact to LT     Reflex Exam:  DTR's:    Deep tendon reflexes in the upper and lower extremities are symmetrical bilaterally.   Toes:    The toes are downgoing bilaterally.   Clonus:    Clonus is absent.    Assessment/Plan: Patient with episodic migraines, here for transfer of care from Ashley. Gaynell Cantu pediatric neurology, doing well on current medications, continue current treatment.    Meds ordered this encounter  Medications   topiramate (TOPAMAX) 25 MG tablet    Sig: Take 1 tablet (25 mg total) by mouth 2 (two) times daily.    Dispense:  180 tablet    Refill:  4    Cc: Ashley Geralds, MD,  Cox, [name], MD  Sarina Ill, MD  [facility] [address] [place],  [address]  Phone [phone] Fax [phone]

## 2021-09-12 ENCOUNTER — Encounter (HOSPITAL_COMMUNITY): Payer: Self-pay | Admitting: Psychiatry

## 2021-09-12 ENCOUNTER — Telehealth (INDEPENDENT_AMBULATORY_CARE_PROVIDER_SITE_OTHER): Payer: Medicaid Other | Admitting: Psychiatry

## 2021-09-12 DIAGNOSIS — F411 Generalized anxiety disorder: Secondary | ICD-10-CM

## 2021-09-12 DIAGNOSIS — F3181 Bipolar II disorder: Secondary | ICD-10-CM | POA: Diagnosis not present

## 2021-09-12 MED ORDER — HYDROXYZINE HCL 10 MG PO TABS: 10.0000 mg | ORAL_TABLET | Freq: Three times a day (TID) | ORAL | 3 refills | Status: DC | PRN

## 2021-09-12 MED ORDER — TRAZODONE HCL 50 MG PO TABS: 50.0000 mg | ORAL_TABLET | Freq: Every day | ORAL | 3 refills | Status: DC

## 2021-09-12 MED ORDER — LAMOTRIGINE 100 MG PO TABS
100.0000 mg | ORAL_TABLET | Freq: Every day | ORAL | 3 refills | Status: DC
Start: 2021-09-12 — End: 2022-02-21

## 2021-09-12 NOTE — Progress Notes (Signed)
[facility] MD/PA/NP OP Progress Note Virtual Visit via Telephone Note  I connected with Ashley Cantu on 09/12/21 at  3:00 PM EST by telephone and verified that I am speaking with the correct person using two identifiers.  Location: Patient: home Provider: Clinic   I discussed the limitations, risks, security and privacy concerns of performing an evaluation and management service by telephone and the availability of in person appointments. I also discussed with the patient that there may be a patient responsible charge related to this service. The patient expressed understanding and agreed to proceed.   I provided  30 minutes of non-face-to-face time during this encounter.   09/12/2021 2:56 PM Ashley Cantu  MRN:  [id number]  Chief Complaint: "I've been doing good"  HPI:20 y/o female seen today for follow up psychiatric evaluation. She has a psychiatric history of GAD, ADHD, Bipolar II disorder and OCD. Patient is currently prescribed Lamictal 100 mg daily, trazodone 50mg  nightly, and hydroxyzine 10mg  3 times daily as needed. She reports her medications are effective in managing her psychiatric conditions.  Today patient was unable to login virtually so her exam was done over the phone. During assessment she was  pleasant, cooperative, and engaged in conversation. She informed Probation officer that she has been doing good.  She informed Probation officer that her mood is stable and notes she has minimal anxiety and depression.   Today provider conducted a GAD-7 and the patient scored a 3, at last visit she scored a 1. Provider also conducted a PHQ-9 and the patient scored a 3, at [place] visit she scored a 3.  Today she denies SI/HI/VAH or paranoia. She endorses adequate sleep and appetite.   No medication changes made today.  Patient agreeable to continue medications prescribed. No other concerns noted at this time. Visit Diagnosis:    ICD-10-CM   1. Bipolar 2 disorder, major depressive episode (HCC)  F31.81 traZODone  (DESYREL) 50 MG tablet    lamoTRIgine (LAMICTAL) 100 MG tablet    2. 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   Asthma when she was a baby   Bone spur    Bronchiectasis ([place])    Cholelithiasis 04/01/2017   Cough 10/15/2018   Depression    Environmental allergies    Gallbladder problem    GERD (gastroesophageal reflux disease)    Headache    Heartburn    IBS (irritable bowel syndrome)    Joint pain    Lactose intolerance    Migraine variant    Obesity    PONV (postoperative nausea and vomiting)    with the rhinoplasty surgery   Vitamin D deficiency     Past Surgical History:  Procedure Laterality Date   CHOLECYSTECTOMY N/A 04/01/2017   Procedure: LAPAROSCOPIC CHOLECYSTECTOMY;  Surgeon: Stanford Scotland, MD;  Location: [facility];  Service: General;  Laterality: N/A;   RHINOPLASTY     VIDEO BRONCHOSCOPY WITH ENDOBRONCHIAL NAVIGATION N/A 07/11/2020   Procedure: VIDEO BRONCHOSCOPY WITH ENDOBRONCHIAL NAVIGATION  WITH INDOCYANINE GREEN/METHYLENE BLUE MARKING INJECTION OF RIGHT UPPER LOBE MASS;  Surgeon: Grace Isaac, MD;  Location: [place];  Service: Thoracic;  Laterality: N/A;  Family Psychiatric History: Mother Schizoaffective Bipolar type  Family History:  Family History  Problem Relation Age of Onset   Hypertension Mother    Mental illness Mother    Depression Mother    Bipolar disorder Mother    Schizophrenia Mother    Heart disease Mother    Liver disease Mother    Alcohol abuse Mother    Drug abuse Mother    Obesity Mother    Asthma Mother    Migraines Mother    High blood pressure Father    Depression Father    Sleep apnea Father    Obesity  Father    Asthma Father    Migraines Father    COPD Maternal Grandmother    Asthma Maternal Grandmother    Schizophrenia Maternal Grandfather    Bipolar disorder Maternal Grandfather    Asthma Maternal Grandfather    Seizures Paternal Grandmother    Atrial fibrillation Paternal Grandmother    Asthma Paternal Grandmother    Diabetes Paternal Grandfather    Congestive Heart Failure Paternal Grandfather    Asthma Paternal Grandfather     Social History:  Social History   Socioeconomic History   Marital status: Single    Spouse name: Not on file   Number of children: Not on file   Years of education: Not on file   Highest education level: Not on file  Occupational History   Occupation: Scientist, water quality  Tobacco Use   Smoking status: Never    Passive exposure: Yes   Smokeless tobacco: Never  Vaping Use   Vaping Use: Never used  Substance and Sexual Activity   Alcohol use: No   Drug use: No   Sexual activity: Never    Birth control/protection: Abstinence  Other Topics Concern   Not on file  Social History Narrative   Keirstan received her GED.   She attended [employer].   She lives with her dad only.   She has a younger brother.   She works at [employer].   Social Determinants of Health   Financial Resource Strain: Not on file  Food Insecurity: Not on file  Transportation Needs: Not on file  Physical Activity: Not on file  Stress: Not on file  Social Connections: Not on file    Allergies:  Allergies  Allergen Reactions   Penicillins Anaphylaxis, Hives and Swelling    Has patient had a PCN reaction causing immediate rash, facial/tongue/throat swelling, SOB or lightheadedness with hypotension: yes Has patient had a PCN reaction causing severe rash involving mucus membranes or skin necrosis: no Has patient had a PCN reaction that required hospitalization: no Has patient had a PCN reaction occurring within the last 10 years: bo If all of the above answers are "NO", then  may proceed with Cephalosporin use.     Metabolic Disorder Labs: Lab Results  Component Value Date   HGBA1C 5.3 04/24/2020   No results found for: PROLACTIN Lab Results  Component Value Date   CHOL 155 04/24/2020   TRIG 107 (H) 04/24/2020   HDL 45 04/24/2020   CHOLHDL 3.4 04/24/2020   [place] 90 04/24/2020   LDLCALC 78 10/17/2019   Lab Results  Component Value Date   TSH 1.410 04/24/2020   TSH 1.060 10/17/2019    Therapeutic Level Labs: No results found for: LITHIUM No results found for: VALPROATE No components found for:  CBMZ  Current Medications: Current Outpatient Medications  Medication Sig Dispense Refill   albuterol (PROVENTIL HFA;VENTOLIN HFA) 108 (90 BASE)  MCG/ACT inhaler Inhale 2 puffs into the lungs every 6 (six) hours as needed for wheezing or shortness of breath.     cetirizine (ZYRTEC) 10 MG tablet Take 10 mg by mouth daily as needed.     fluticasone (FLONASE) 50 MCG/ACT nasal spray Place 2 sprays into both nostrils daily as needed.  5   hydrOXYzine (ATARAX) 10 MG tablet Take 1 tablet (10 mg total) by mouth 3 (three) times daily as needed. 90 tablet 3   lamoTRIgine (LAMICTAL) 100 MG tablet Take 1 tablet (100 mg total) by mouth daily. 30 tablet 3   montelukast (SINGULAIR) 10 MG tablet Take 1 tablet (10 mg total) by mouth at bedtime. 30 tablet 11   NEXIUM 40 MG capsule Take 40 mg by mouth daily.   1   topiramate (TOPAMAX) 25 MG tablet Take 1 tablet (25 mg total) by mouth 2 (two) times daily. 180 tablet 4   traZODone (DESYREL) 50 MG tablet Take 1 tablet (50 mg total) by mouth at bedtime. 30 tablet 3   No current facility-administered medications for this visit.     Musculoskeletal: Strength & Muscle Tone:  Unable to assess due to telephone visit Gait & Station: normal, Unable to assess due to telephone visit Patient leans: N/A  Psychiatric Specialty Exam: Review of Systems  There were no vitals taken for this visit.There is no height or weight on file  to calculate BMI.  General Appearance:  Unable to assess due to telephone visit  Eye Contact:   Unable to assess due to telephone visit  Speech:  Clear and Coherent and Normal Rate  Volume:  Normal  Mood:  Euthymic  Affect:  Appropriate and Congruent  Thought Process:  Coherent, Goal Directed and Linear  Orientation:  Full (Time, Place, and Person)  Thought Content: WDL and Logical   Suicidal Thoughts:  No  Homicidal Thoughts:  No  Memory:  Immediate;   Good Recent;   Good Remote;   Good  Judgement:  Good  Insight:  Good  Psychomotor Activity:  Normal  Concentration:  Concentration: Good and Attention Span: Good  Recall:  Good  Fund of Knowledge: Good  Language: Good  Akathisia:  No  Handed:  Right  AIMS (if indicated): Not done  Assets:  Communication Skills Desire for Improvement Financial Resources/Insurance Housing Social Support  ADL's:  Intact  Cognition: WNL  Sleep:  Good   Screenings: GAD-7    Flowsheet Row Video Visit from 09/12/2021 in [facility] Video Visit from 06/12/2021 in [facility] Video Visit from 03/12/2021 in [facility] Video Visit from 12/13/2020 in [facility] Counselor from 10/30/2020 in [facility]  Total GAD-7 Score 3 1 2 5 3       PHQ2-9    Flowsheet Row Video Visit from 09/12/2021 in [facility] Video Visit from 06/12/2021 in [facility] Video Visit from 03/12/2021 in [facility] Video Visit from 12/13/2020 in [facility] Counselor from 10/30/2020 in [facility]  PHQ-2 Total Score 1 1 1 2 2   PHQ-9 Total Score 3 3 2 4 5       Flowsheet Row Video Visit from 03/12/2021 in [facility] Most recent reading at 03/12/2021 12:10 PM ED from  03/12/2021 in [place] DEPT Most recent reading at 03/12/2021  5:36 AM Video  Visit from 12/13/2020 in [facility] Most recent reading at 12/13/2020  3:41 PM  C-SSRS RISK CATEGORY No Risk No Risk No Risk        Assessment and Plan: Patient notes that she is doing well on her current medication regimen. No medication changes made today.  Patient agreeable to continue medications prescribed. 1. Generalized anxiety disorder  Continue- hydrOXYzine (ATARAX/VISTARIL) 10 MG tablet; Take 1 tablet (10 mg total) by mouth 3 (three) times daily as needed.  Dispense: 90 tablet; Refill: 3  2. Bipolar 2 disorder, major depressive episode (HCC) Continue- lamoTRIgine (LAMICTAL) 100 MG tablet; Take 1 tablet (100 mg total) by mouth daily.  Dispense: 30 tablet; Refill: 3 Continue- traZODone (DESYREL) 50 MG tablet; Take 1 tablet (50 mg total) by mouth at bedtime.  Dispense: 30 tablet; Refill: 3    Follow-up in 3 month Follow-up with therapy  Salley Slaughter, NP 09/12/2021, 2:56 PM

## 2021-11-13 DIAGNOSIS — H7201 Central perforation of tympanic membrane, right ear: Secondary | ICD-10-CM | POA: Insufficient documentation

## 2021-11-13 HISTORY — DX: Central perforation of tympanic membrane, right ear: H72.01

## 2021-11-27 ENCOUNTER — Telehealth (INDEPENDENT_AMBULATORY_CARE_PROVIDER_SITE_OTHER): Payer: Medicaid Other | Admitting: Psychiatry

## 2021-11-27 DIAGNOSIS — F3181 Bipolar II disorder: Secondary | ICD-10-CM

## 2021-11-27 DIAGNOSIS — F411 Generalized anxiety disorder: Secondary | ICD-10-CM

## 2021-11-27 NOTE — Progress Notes (Signed)
[place] MD/PA/NP OP Progress Note  11/27/2021 10:19 AM Ashley Cantu  MRN:  [id number]  Virtual Visit via Telephone Note  I connected with Ashley Cantu on 11/27/21 at  9:30 AM EST by telephone and verified that I am speaking with the correct person using two identifiers.  Location: Patient: home Provider: off site   I discussed the limitations, risks, security and privacy concerns of performing an evaluation and management service by telephone and the availability of in person appointments. I also discussed with the patient that there may be a patient responsible charge related to this service. The patient expressed understanding and agreed to proceed.    I discussed the assessment and treatment plan with the patient. The patient was provided an opportunity to ask questions and all were answered. The patient agreed with the plan and demonstrated an understanding of the instructions.   The patient was advised to call back or seek an in-person evaluation if the symptoms worsen or if the condition fails to improve as anticipated.  I provided 10 minutes of non-face-to-face time during this encounter.   [name], NP   Chief Complaint: Medication management  HPI: Ashley Cantu is a 21 year old female presenting to [facility] behavioral health outpatient for a follow-up psychiatric evaluation.  Patient has a psychiatric history of generalized anxiety disorder, ADHD, binge eating and bipolar disorder.  Patient's symptoms are managed with hydroxyzine 10 mg 3 times daily as needed, Lamictal 100 mg daily, and trazodone 50 mg at bedtime.  Patient reports medication compliance and denies adverse reactions.  Patient stated I am improving with taking her medications as prescribed.  Patient denies the need for dose adjustment today. Patient is alert and oriented x4, calm, and pleasant, willing to engage.  Patient reports a good mood, stating things have improved since her last visit.  Patient reports  an improving appetite, stating that she tries to eat meals multiple times daily as opposed to once daily.  Patient reports an improving sleep cycle, stating she does not need trazodone as often as needed previously.  Patient reports that her anxiety is improving as well and uses her hydroxyzine less often.  Patient denies suicidal or homicidal ideations, paranoia, delusions, or auditory or visual hallucinations.  Visit Diagnosis:    ICD-10-CM   1. Bipolar 2 disorder, major depressive episode ([place])  F31.81     2. 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   Asthma when she was a baby   Bone spur    Bronchiectasis ([place])    Cholelithiasis 04/01/2017   Cough 10/15/2018   Depression    Environmental allergies    Gallbladder problem    GERD (gastroesophageal reflux disease)    Headache    Heartburn    IBS (irritable bowel syndrome)    Joint pain    Lactose intolerance    Migraine variant    Obesity    PONV (postoperative nausea and vomiting)    with the rhinoplasty surgery   Vitamin D deficiency     Past Surgical History:  Procedure Laterality Date   CHOLECYSTECTOMY N/A 04/01/2017   Procedure: LAPAROSCOPIC CHOLECYSTECTOMY;  Surgeon: Marliss Coots  O, MD;  Location: [place];  Service: General;  Laterality: N/A;   RHINOPLASTY     VIDEO BRONCHOSCOPY WITH ENDOBRONCHIAL NAVIGATION N/A 07/11/2020   Procedure: VIDEO BRONCHOSCOPY WITH ENDOBRONCHIAL NAVIGATION  WITH INDOCYANINE GREEN/METHYLENE BLUE MARKING INJECTION OF RIGHT UPPER LOBE MASS;  Surgeon: Grace Isaac, MD;  Location: [place];  Service: Thoracic;  Laterality: N/A;    Family Psychiatric History: See below  Family History:   Family History  Problem Relation Age of Onset   Hypertension Mother    Mental illness Mother    Depression Mother    Bipolar disorder Mother    Schizophrenia Mother    Heart disease Mother    Liver disease Mother    Alcohol abuse Mother    Drug abuse Mother    Obesity Mother    Asthma Mother    Migraines Mother    High blood pressure Father    Depression Father    Sleep apnea Father    Obesity Father    Asthma Father    Migraines Father    COPD Maternal Grandmother    Asthma Maternal Grandmother    Schizophrenia Maternal Grandfather    Bipolar disorder Maternal Grandfather    Asthma Maternal Grandfather    Seizures Paternal Grandmother    Atrial fibrillation Paternal Grandmother    Asthma Paternal Grandmother    Diabetes Paternal Grandfather    Congestive Heart Failure Paternal Grandfather    Asthma Paternal Grandfather     Social History:  Social History   Socioeconomic History   Marital status: Single    Spouse name: Not on file   Number of children: Not on file   Years of education: Not on file   Highest education level: Not on file  Occupational History   Occupation: Scientist, water quality  Tobacco Use   Smoking status: Never    Passive exposure: Yes   Smokeless tobacco: Never  Vaping Use   Vaping Use: Never used  Substance and Sexual Activity   Alcohol use: No   Drug use: No   Sexual activity: Never    Birth control/protection: Abstinence  Other Topics Concern   Not on file  Social History Narrative   [place] received her GED.   She attended [employer].   She lives with her dad only.   She has a younger brother.   She works at [employer].   Social Determinants of Health   Financial Resource Strain: Not on file  Food Insecurity: Not on file  Transportation Needs: Not on file  Physical Activity: Not on file  Stress: Not on file  Social Connections: Not on file    Allergies:  Allergies  Allergen Reactions   Penicillins Anaphylaxis, Hives and  Swelling    Has patient had a PCN reaction causing immediate rash, facial/tongue/throat swelling, SOB or lightheadedness with hypotension: yes Has patient had a PCN reaction causing severe rash involving mucus membranes or skin necrosis: no Has patient had a PCN reaction that required hospitalization: no Has patient had a PCN reaction occurring within the last 10 years: bo If all of the above answers are "NO", then may proceed with Cephalosporin use.     Metabolic Disorder Labs: Lab Results  Component Value Date   HGBA1C 5.3 04/24/2020   No results found for: PROLACTIN Lab Results  Component Value Date   CHOL 155 04/24/2020   TRIG 107 (H) 04/24/2020   HDL 45 04/24/2020   CHOLHDL 3.4 04/24/2020   [place] 90 04/24/2020  [place] 78 10/17/2019   Lab Results  Component Value Date   TSH 1.410 04/24/2020   TSH 1.060 10/17/2019    Therapeutic Level Labs: No results found for: LITHIUM No results found for: VALPROATE No components found for:  CBMZ  Current Medications: Current Outpatient Medications  Medication Sig Dispense Refill   albuterol (PROVENTIL HFA;VENTOLIN HFA) 108 (90 BASE) MCG/ACT inhaler Inhale 2 puffs into the lungs every 6 (six) hours as needed for wheezing or shortness of breath.     cetirizine (ZYRTEC) 10 MG tablet Take 10 mg by mouth daily as needed.     fluticasone (FLONASE) 50 MCG/ACT nasal spray Place 2 sprays into both nostrils daily as needed.  5   hydrOXYzine (ATARAX) 10 MG tablet Take 1 tablet (10 mg total) by mouth 3 (three) times daily as needed. 90 tablet 3   lamoTRIgine (LAMICTAL) 100 MG tablet Take 1 tablet (100 mg total) by mouth daily. 30 tablet 3   montelukast (SINGULAIR) 10 MG tablet Take 1 tablet (10 mg total) by mouth at bedtime. 30 tablet 11   NEXIUM 40 MG capsule Take 40 mg by mouth daily.   1   topiramate (TOPAMAX) 25 MG tablet Take 1 tablet (25 mg total) by mouth 2 (two) times daily. 180 tablet 4   traZODone (DESYREL) 50 MG tablet Take 1  tablet (50 mg total) by mouth at bedtime. 30 tablet 3   No current facility-administered medications for this visit.     Musculoskeletal: Strength & Muscle Tone: N/A virtual visit Gait & Station: N/A Patient leans: N/A  Psychiatric Specialty Exam: Review of Systems  Psychiatric/Behavioral:  Negative for hallucinations, self-injury and suicidal ideas.   All other systems reviewed and are negative.  There were no vitals taken for this visit.There is no height or weight on file to calculate BMI.  General Appearance: N/A  Eye Contact: N/A  Speech: Good  Volume: Normal  Mood: Euthymic  Affect: N/A  Thought Process: Goal directed  Orientation:  Full (Time, Place, and Person)  Thought Content: Logical  Suicidal Thoughts: No  Homicidal Thoughts: No  Memory: Good  Judgement: Good  Insight: Good  Psychomotor Activity: N/A  Concentration: Concentration good  Recall: Good  Fund of Knowledge: Good  Language: Good  Akathisia: N/A  Handed: Right  AIMS (if indicated): Not done  Assets:  Communication Skills Desire for Improvement  ADL's:  Intact  Cognition: WNL  Sleep:  Good   Screenings: GAD-7    Flowsheet Row Video Visit from 09/12/2021 in [facility] Video Visit from 06/12/2021 in [facility] Video Visit from 03/12/2021 in [facility] Video Visit from 12/13/2020 in [facility] Counselor from 10/30/2020 in [facility]  Total GAD-7 Score '3 1 2 5 3      '$ PHQ2-9    Flowsheet Row Video Visit from 09/12/2021 in [facility] Video Visit from 06/12/2021 in [facility] Video Visit from 03/12/2021 in [facility] Video Visit from 12/13/2020 in [facility] Counselor from 10/30/2020 in [place]  PHQ-2 Total  Score '1 1 1 2 2  '$ PHQ-9 Total Score '3 3 2 4 5      '$ Flowsheet Row Video Visit from 03/12/2021 in [facility] Most recent reading at 03/12/2021 12:10 PM ED from 03/12/2021 in [place] DEPT Most recent  reading at 03/12/2021  5:36 AM Video Visit from 12/13/2020 in [facility] Most recent reading at 12/13/2020  3:41 PM  C-SSRS RISK CATEGORY No Risk No Risk No Risk        Assessment and Plan: Avereigh Spainhower is a 21 year old female presenting to [facility] behavioral health outpatient for a follow-up psychiatric evaluation.  Patient has a psychiatric history of generalized anxiety disorder, ADHD, binge eating and bipolar disorder.  Patient's symptoms are managed with hydroxyzine 10 mg 3 times daily as needed, Lamictal 100 mg daily, and trazodone 50 mg at bedtime.  Patient reports medication compliance and denies adverse reactions.  Patient stated I am improving with taking her medications as prescribed.  Patient denies the need for dose adjustment today.  Patient denies need for medication refills today.  No medication changes.  Collaboration of Care: Collaboration of Care: Medication Management AEB medications E scribed to patient's preferred pharmacy  1. Bipolar 2 disorder, major depressive episode (HCC) Continue Lamictal 100 mg daily  2. Generalized anxiety disorder Continue hydroxyzine 10 mg 3 times daily as needed for anxiety  Insomnia: Continue trazodone 50 mg daily at bedtime  Follow-up in 3 months  Patient/Guardian was advised Release of Information must be obtained prior to any record release in order to collaborate their care with an outside provider. Patient/Guardian was advised if they have not already done so to contact the registration department to sign all necessary forms in order for [place] to release information regarding their care.   Consent: Patient/Guardian gives verbal consent for  treatment and assignment of benefits for services provided during this visit. Patient/Guardian expressed understanding and agreed to proceed.    [name], NP 11/27/2021, 10:19 AM

## 2021-12-10 ENCOUNTER — Other Ambulatory Visit: Payer: Self-pay

## 2021-12-10 ENCOUNTER — Encounter (HOSPITAL_BASED_OUTPATIENT_CLINIC_OR_DEPARTMENT_OTHER): Payer: Self-pay | Admitting: Family Medicine

## 2021-12-10 ENCOUNTER — Ambulatory Visit (INDEPENDENT_AMBULATORY_CARE_PROVIDER_SITE_OTHER): Payer: Medicaid Other | Admitting: Family Medicine

## 2021-12-10 VITALS — BP 126/68 | HR 109 | Temp 97.4°F | Ht 67.75 in | Wt >= 6400 oz

## 2021-12-10 DIAGNOSIS — E559 Vitamin D deficiency, unspecified: Secondary | ICD-10-CM

## 2021-12-10 DIAGNOSIS — K219 Gastro-esophageal reflux disease without esophagitis: Secondary | ICD-10-CM

## 2021-12-10 DIAGNOSIS — J3089 Other allergic rhinitis: Secondary | ICD-10-CM

## 2021-12-10 DIAGNOSIS — Z0000 Encounter for general adult medical examination without abnormal findings: Secondary | ICD-10-CM

## 2021-12-10 DIAGNOSIS — J4520 Mild intermittent asthma, uncomplicated: Secondary | ICD-10-CM | POA: Diagnosis not present

## 2021-12-10 MED ORDER — ALBUTEROL SULFATE HFA 108 (90 BASE) MCG/ACT IN AERS: 2.0000 | INHALATION_SPRAY | Freq: Four times a day (QID) | RESPIRATORY_TRACT | 1 refills | Status: DC | PRN

## 2021-12-10 MED ORDER — ESOMEPRAZOLE MAGNESIUM 40 MG PO CPDR: 40.0000 mg | DELAYED_RELEASE_CAPSULE | Freq: Every day | ORAL | 1 refills | Status: DC

## 2021-12-10 MED ORDER — MONTELUKAST SODIUM 10 MG PO TABS: 10.0000 mg | ORAL_TABLET | Freq: Every day | ORAL | 11 refills | Status: DC

## 2021-12-10 MED ORDER — CETIRIZINE HCL 10 MG PO TABS: 10.0000 mg | ORAL_TABLET | Freq: Every day | ORAL | 1 refills | Status: DC | PRN

## 2021-12-10 MED ORDER — FLUTICASONE PROPIONATE 50 MCG/ACT NA SUSP: 2.0000 | Freq: Every day | NASAL | 1 refills | Status: DC | PRN

## 2021-12-10 NOTE — Patient Instructions (Addendum)
Allergies, Adult ?An allergy is a condition in which the body's defense system (immune system) comes in contact with an allergen and reacts to it. An allergen is anything that causes an allergic reaction. Allergens cause the immune system to make proteins for fighting infections (antibodies). These antibodies cause cells to release chemicals called histamines that set off the symptoms of an allergic reaction. ?Allergies often affect the nasal passages (allergic rhinitis), eyes (allergic conjunctivitis), skin (atopic dermatitis), and stomach. Allergies can be mild, moderate, or severe. They cannot spread from person to person. Allergies can develop at any age and may be outgrown. ?What are the causes? ?This condition is caused by allergens. Common allergens include: ?Outdoor allergens, such as pollen, car fumes, and mold. ?Indoor allergens, such as dust, smoke, mold, and pet dander. ?Other allergens, such as foods, medicines, scents, insect bites or stings, and other skin irritants. ?What increases the risk? ?You are more likely to develop this condition if you have: ?Family members with allergies. ?Family members who have any condition that may be caused by allergens, such as asthma. This may make you more likely to have other allergies. ?What are the signs or symptoms? ?Symptoms of this condition depend on the severity of the allergy. ?Mild to moderate symptoms ?Runny nose, stuffy nose (nasal congestion), or sneezing. ?Itchy mouth, ears, or throat. ?A feeling of mucus dripping down the back of your throat (postnasal drip). ?Sore throat. ?Itchy, red, watery, or puffy eyes. ?Skin rash, or itchy, red, swollen areas of skin (hives). ?Stomach cramps or bloating. ?Severe symptoms ?Severe allergies to food, medicine, or insect bites may cause anaphylaxis, which can be life-threatening. Symptoms include: ?A red (flushed) face. ?Wheezing or coughing. ?Swollen lips, tongue, or mouth. ?Tight or swollen throat. ?Chest pain or  tightness, or rapid heartbeat. ?Trouble breathing or shortness of breath. ?Pain in the abdomen, vomiting, or diarrhea. ?Dizziness or fainting. ?How is this diagnosed? ?This condition is diagnosed based on your symptoms, your family and medical history, and a physical exam. You may also have tests, including: ?Skin tests to see how your skin reacts to allergens that may be causing your symptoms. Tests include: ?Skin prick test. For this test, an allergen is introduced to your body through a small opening in the skin. ?Intradermal skin test. For this test, a small amount of allergen is injected under the first layer of your skin. ?Patch test. For this test, a small amount of allergen is placed on your skin. The area is covered and then checked after a few days. ?Blood tests. ?A challenge test. For this test, you will eat or breathe in a small amount of allergen to see if you have an allergic reaction. ?You may also be asked to: ?Keep a food diary. This is a record of all the foods, drinks, and symptoms you have in a day. ?Try an elimination diet. To do this: ?Remove certain foods from your diet. ?Add those foods back one by one to find out if any foods cause an allergic reaction. ?How is this treated? ?  ?Treatment for allergies depends on your symptoms. Treatment may include: ?Cold, wet cloths (cold compresses) to soothe itching and swelling. ?Eye drops or nasal sprays. ?Nasal irrigation to help clear your mucus or keep the nasal passages moist. ?A humidifier to add moisture to the air. ?Skin creams to treat rashes or itching. ?Oral antihistamines or other medicines to block the reaction or to treat inflammation. ?Diet changes to remove foods that cause allergies. ?Being exposed again  and again to tiny amounts of allergens to help you build a defense against it (tolerance). This is called immunotherapy. Examples include: ?Allergy shot. You receive an injection that contains an allergen. ?Sublingual immunotherapy. You  take a small dose of allergen under your tongue. ?Emergency injection for anaphylaxis. You give yourself a shot using a syringe (auto-injector) that contains the amount of medicine you need. Your health care provider will teach you how to give yourself an injection. ?Follow these instructions at home: ?Medicines ? ?Take or apply over-the-counter and prescription medicines only as told by your health care provider. ?Always carry your auto-injector pen if you are at risk of anaphylaxis. Give yourself an injection as told by your health care provider. ?Eating and drinking ?Follow instructions from your health care provider about eating or drinking restrictions. ?Drink enough fluid to keep your urine pale yellow. ?General instructions ?Wear a medical alert bracelet or necklace to let others know that you have had anaphylaxis before. ?Avoid known allergens whenever possible. ?Keep all follow-up visits as told by your health care provider. This is important. ?Contact a health care provider if: ?Your symptoms do not get better with treatment. ?Get help right away if: ?You have symptoms of anaphylaxis. These include: ?Swollen mouth, tongue, or throat. ?Pain or tightness in your chest. ?Trouble breathing or shortness of breath. ?Dizziness or fainting. ?Severe abdominal pain, vomiting, or diarrhea. ?These symptoms may represent a serious problem that is an emergency. Do not wait to see if the symptoms will go away. Get medical help right away. Call your local emergency services (911 in the [place] Do not drive yourself to the hospital. ?Summary ?Take or apply over-the-counter and prescription medicines only as told by your health care provider. ?Avoid known allergens when possible. ?Always carry your auto-injector pen if you are at risk of anaphylaxis. Give yourself an injection as told by your health care provider. ?Wear a medical alert bracelet or necklace to let others know that you have had anaphylaxis before. ?Anaphylaxis  is a life-threatening emergency. Get help right away. ?This information is not intended to replace advice given to you by your health care provider. Make sure you discuss any questions you have with your health care provider. ?Document Revised: 05/07/2020 Document Reviewed: 07/20/2019 ?Elsevier Patient Education ? [place]. ? ?Medication Instructions:  ?Your physician recommends that you continue on your current medications as directed. Please refer to the Current Medication list given to you today. ?--If you need a refill on any your medications before your next appointment, please call your pharmacy first. If no refills are authorized on file call the office.-- ? ? ? ?Follow-Up: ?Your next appointment:   ?Your physician recommends that you schedule a follow-up appointment in: 3 month  with Dr. [place] ? ?You will receive a text message or e-mail with a link to a survey about your care and experience with [place] today! We would greatly appreciate your feedback!  ? ?Thanks for letting us be apart of your health journey!!  ?Primary Care and Sports Medicine  ? ?Dr. Kyung Rudd de [place]  ? ?We encourage you to activate your patient portal called "MyChart".  Sign up information is provided on this After Visit Summary.  MyChart is used to connect with patients for Virtual Visits (Telemedicine).  Patients are able to view lab/test results, encounter notes, upcoming appointments, etc.  Non-urgent messages can be sent to your provider as well. To learn more about what you can do with MyChart, please visit --  [email or web address].    ?

## 2021-12-10 NOTE — Assessment & Plan Note (Signed)
Provided refill of albuterol inhaler ?

## 2021-12-10 NOTE — Assessment & Plan Note (Signed)
Indicates good control of symptoms with use of Nexium, requesting refill, refill sent to pharmacy on file ?

## 2021-12-10 NOTE — Assessment & Plan Note (Signed)
Requesting refill of medications today including Zyrtec, Singulair, Flonase.  Refill sent to pharmacy on file ?

## 2021-12-10 NOTE — Progress Notes (Signed)
New Patient Office Visit  Subjective:  Patient ID: Ashley Cantu, female    DOB: Feb 02, 2001  Age: 21 y.o. MRN: [id number]  CC:  Chief Complaint  Patient presents with   New Patient (Initial Visit)    HPI Ashley Cantu is a 21 year old female presenting to establish in clinic.  She has current concerns as outlined above.  Past medical history is significant for GERD, environmental allergies, asthma, anxiety and depression, vitamin D deficiency.  GERD: Currently manages with use of Nexium, requesting refill of this today.  Feels that symptoms have been well controlled with use of PPI  Allergies: Current medications include Zyrtec, Flonase, Singulair.  Medications utilized will vary depending on severity of allergy symptoms.  Generally these medications help to control symptoms well.  Vitamin D deficiency: Has required supplementation at times in the past, last vitamin D check was about a year ago and was low at 21.  Vitamin D was being checked and treated by pediatric nephrologist she was seeing in the past, she is no longer following up with that provider.  Migraines: Generally she has not been having issues in regards to headaches.  Previously was using Topamax to help with symptoms, was following with pediatric neurologist.  She has established with a new neurologist, next appointment is later this year  Anxiety depression: Does follow with behavioral health, current medications include hydroxyzine, trazodone  Patient is originally from [place].  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  Outside of work she enjoys reading, she also does babysitting.  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   Asthma when she was a baby   Bone spur    Bronchiectasis (HCC)    Cholelithiasis 04/01/2017   Cough 10/15/2018    Depression    Environmental allergies    Gallbladder problem    GERD (gastroesophageal reflux disease)    Headache    Heartburn    IBS (irritable bowel syndrome)    Joint pain    Lactose intolerance    Migraine variant    Obesity    PONV (postoperative nausea and vomiting)    with the rhinoplasty surgery   Vitamin D deficiency     Past Surgical History:  Procedure Laterality Date   CHOLECYSTECTOMY N/A 04/01/2017   Procedure: LAPAROSCOPIC CHOLECYSTECTOMY;  Surgeon: Kandice Hams, MD;  Location: [facility];  Service: General;  Laterality: N/A;   RHINOPLASTY     VIDEO BRONCHOSCOPY WITH ENDOBRONCHIAL NAVIGATION N/A 07/11/2020   Procedure: VIDEO BRONCHOSCOPY WITH ENDOBRONCHIAL NAVIGATION  WITH INDOCYANINE GREEN/METHYLENE BLUE MARKING INJECTION OF RIGHT UPPER LOBE MASS;  Surgeon: Delight Ovens, MD;  Location: [facility];  Service: Thoracic;  Laterality: N/A;    Family History  Problem Relation Age of Onset   Hypertension Mother    Mental illness Mother    Depression Mother    Bipolar disorder Mother    Schizophrenia Mother    Heart disease Mother    Liver disease Mother    Alcohol abuse Mother    Drug abuse Mother    Obesity Mother    Asthma Mother    Migraines Mother    High blood pressure Father    Depression Father    Sleep apnea Father    Obesity Father  Asthma Father    Migraines Father    COPD Maternal Grandmother    Asthma Maternal Grandmother    Schizophrenia Maternal Grandfather    Bipolar disorder Maternal Grandfather    Asthma Maternal Grandfather    Seizures Paternal Grandmother    Atrial fibrillation Paternal Grandmother    Asthma Paternal Grandmother    Diabetes Paternal Grandfather    Congestive Heart Failure Paternal Grandfather    Asthma Paternal Grandfather     Social History   Socioeconomic History   Marital status: Single    Spouse name: Not on file   Number of children: Not on file   Years of education: Not on file   Highest education  level: Not on file  Occupational History   Occupation: Conservation officer, nature  Tobacco Use   Smoking status: Never    Passive exposure: Yes   Smokeless tobacco: Never  Vaping Use   Vaping Use: Never used  Substance and Sexual Activity   Alcohol use: No   Drug use: No   Sexual activity: Never    Birth control/protection: Abstinence  Other Topics Concern   Not on file  Social History Narrative   Ashley Cantu received her GED.   She attended [employer].   She lives with her dad only.   She has a younger brother.   She works at [employer].   Social Determinants of Health   Financial Resource Strain: Not on file  Food Insecurity: Not on file  Transportation Needs: Not on file  Physical Activity: Not on file  Stress: Not on file  Social Connections: Not on file  Intimate Partner Violence: Not on file    Objective:   Today's Vitals: BP 126/68   Pulse (!) 109   Temp (!) 97.4 F (36.3 C)   Ht 5' 7.75" (1.721 m)   Wt (!) 466 lb 12.8 oz (211.7 kg)   LMP 11/23/2021   SpO2 100%   BMI 71.50 kg/m   Physical Exam  21 year old female in no acute distress Cardiovascular exam with regular rate and rhythm Lungs clear to auscultation bilaterally  Assessment & Plan:   Problem List Items Addressed This Visit       Respiratory   Asthma    Provided refill of albuterol inhaler      Relevant Medications   albuterol (VENTOLIN HFA) 108 (90 Base) MCG/ACT inhaler   montelukast (SINGULAIR) 10 MG tablet     Digestive   Gastroesophageal reflux disease    Indicates good control of symptoms with use of Nexium, requesting refill, refill sent to pharmacy on file      Relevant Medications   esomeprazole (NEXIUM) 40 MG capsule     Other   Vitamin D deficiency    Noted on labs in the past, no recent check, will check labs with upcoming office visit to assess current status      Relevant Orders   VITAMIN D 25 Hydroxy (Vit-D Deficiency, Fractures)   Environmental and seasonal allergies - Primary     Requesting refill of medications today including Zyrtec, Singulair, Flonase.  Refill sent to pharmacy on file      Relevant Medications   cetirizine (ZYRTEC) 10 MG tablet   fluticasone (FLONASE) 50 MCG/ACT nasal spray   montelukast (SINGULAIR) 10 MG tablet   Other Visit Diagnoses     Wellness examination       Relevant Orders   CBC with Differential/Platelet   Comprehensive metabolic panel   Lipid panel   TSH Rfx on  Abnormal to Free T4   Hemoglobin A1c       Outpatient Encounter Medications as of 12/10/2021  Medication Sig   esomeprazole (NEXIUM) 40 MG capsule Take 1 capsule (40 mg total) by mouth daily.   hydrOXYzine (ATARAX) 10 MG tablet Take 1 tablet (10 mg total) by mouth 3 (three) times daily as needed.   lamoTRIgine (LAMICTAL) 100 MG tablet Take 1 tablet (100 mg total) by mouth daily.   traZODone (DESYREL) 50 MG tablet Take 1 tablet (50 mg total) by mouth at bedtime.   Vitamin D, Ergocalciferol, (DRISDOL) 1.25 MG (50000 UNIT) CAPS capsule Take 50,000 Units by mouth every 7 (seven) days.   [DISCONTINUED] albuterol (PROVENTIL HFA;VENTOLIN HFA) 108 (90 BASE) MCG/ACT inhaler Inhale 2 puffs into the lungs every 6 (six) hours as needed for wheezing or shortness of breath.   [DISCONTINUED] cetirizine (ZYRTEC) 10 MG tablet Take 10 mg by mouth daily as needed.   [DISCONTINUED] fluticasone (FLONASE) 50 MCG/ACT nasal spray Place 2 sprays into both nostrils daily as needed.   [DISCONTINUED] montelukast (SINGULAIR) 10 MG tablet Take 1 tablet (10 mg total) by mouth at bedtime.   [DISCONTINUED] NEXIUM 40 MG capsule Take 40 mg by mouth daily.    albuterol (VENTOLIN HFA) 108 (90 Base) MCG/ACT inhaler Inhale 2 puffs into the lungs every 6 (six) hours as needed for wheezing or shortness of breath.   cetirizine (ZYRTEC) 10 MG tablet Take 1 tablet (10 mg total) by mouth daily as needed.   fluticasone (FLONASE) 50 MCG/ACT nasal spray Place 2 sprays into both nostrils daily as needed.    montelukast (SINGULAIR) 10 MG tablet Take 1 tablet (10 mg total) by mouth at bedtime.   [DISCONTINUED] topiramate (TOPAMAX) 25 MG tablet Take 1 tablet (25 mg total) by mouth 2 (two) times daily. (Patient not taking: Reported on 12/10/2021)   No facility-administered encounter medications on file as of 12/10/2021.    Follow-up: Return in about 3 months (around 03/12/2022) for CPE.  Plan for follow-up in about 2 to 3 months for CPE, nurse visit 1 week prior for labs  Tayva Easterday J [place], MD

## 2021-12-10 NOTE — Assessment & Plan Note (Signed)
Noted on labs in the past, no recent check, will check labs with upcoming office visit to assess current status ?

## 2022-02-20 ENCOUNTER — Telehealth (HOSPITAL_COMMUNITY): Payer: Medicaid Other | Admitting: Psychiatry

## 2022-02-21 ENCOUNTER — Encounter (HOSPITAL_COMMUNITY): Payer: Self-pay | Admitting: Psychiatry

## 2022-02-21 ENCOUNTER — Telehealth (INDEPENDENT_AMBULATORY_CARE_PROVIDER_SITE_OTHER): Payer: Medicaid Other | Admitting: Psychiatry

## 2022-02-21 DIAGNOSIS — F3181 Bipolar II disorder: Secondary | ICD-10-CM | POA: Diagnosis not present

## 2022-02-21 MED ORDER — TRAZODONE HCL 50 MG PO TABS: 50.0000 mg | ORAL_TABLET | Freq: Every day | ORAL | 3 refills | Status: DC

## 2022-02-21 MED ORDER — LAMOTRIGINE 100 MG PO TABS: 100.0000 mg | ORAL_TABLET | Freq: Every day | ORAL | 3 refills | Status: DC

## 2022-02-21 NOTE — Progress Notes (Signed)
[place] MD/PA/NP OP Progress Note Virtual Visit via Telephone Note  I connected with Ashley Cantu on 02/21/22 at 11:00 AM EDT by telephone and verified that I am speaking with the correct person using two identifiers.  Location: Patient: home Provider: Clinic   I discussed the limitations, risks, security and privacy concerns of performing an evaluation and management service by telephone and the availability of in person appointments. I also discussed with the patient that there may be a patient responsible charge related to this service. The patient expressed understanding and agreed to proceed.   I provided  30 minutes of non-face-to-face time during this encounter.   02/21/2022 8:50 AM Ashley Cantu  MRN:  [id number]  Chief Complaint: "I'm good"  HPI:20 y/o female seen today for follow up psychiatric evaluation. She has a psychiatric history of GAD, ADHD, Bipolar II disorder and OCD. Patient is currently prescribed Lamictal 100 mg daily, trazodone '50mg'$  nightly, and hydroxyzine '10mg'$  3 times daily as needed. She reports that she has discontinued hydroxyzine and reports that her other medications are effective in managing her psychiatric conditions.  Today patient was unable to login virtually so her exam was done over the phone. During assessment she was  pleasant, cooperative, and engaged in conversation. She informed Probation officer that she has been doing good.  She notes that she has minimal anxiety and depression and reports that her mood is stable.  Provider conducted a GAD-7 and patient scored a 1.  Provider also conducted PHQ-9 and patient a 2.  She endorses adequate sleep and appetite.  Today she denies SI/HI/VAH or paranoia.   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    At this time patient does not want to restart hydroxyzine.  She will continue Lamictal and trazodone as prescribed.   No other concerns noted at this time. Visit Diagnosis:    ICD-10-CM    1. Bipolar 2 disorder, major depressive episode (HCC)  F31.81 traZODone (DESYREL) 50 MG tablet    lamoTRIgine (LAMICTAL) 100 MG tablet      Past Psychiatric History: anxiety, depression, ADHD, and OCD  Past Medical History:  Past Medical History:  Diagnosis Date   Abscess    Abscess of right genital labia 10/14/2018   ADD (attention deficit disorder)    ADHD    Anxiety    and depression   Aspergilloma ([place]) 09/28/2018   Asthma when she was a baby   Bone spur    Bronchiectasis ([place])    Cholelithiasis 04/01/2017   Cough 10/15/2018   Depression    Environmental allergies    Gallbladder problem    GERD (gastroesophageal reflux disease)    Headache    Heartburn    IBS (irritable bowel syndrome)    Joint pain    Lactose intolerance    Migraine variant    Obesity    PONV (postoperative nausea and vomiting)    with the rhinoplasty surgery   Vitamin D deficiency     Past Surgical History:  Procedure Laterality Date   CHOLECYSTECTOMY N/A 04/01/2017   Procedure: LAPAROSCOPIC CHOLECYSTECTOMY;  Surgeon: Stanford Scotland, MD;  Location: [place] [facility];  Service: General;  Laterality: N/A;   RHINOPLASTY     VIDEO BRONCHOSCOPY WITH ENDOBRONCHIAL NAVIGATION N/A 07/11/2020   Procedure: VIDEO BRONCHOSCOPY WITH ENDOBRONCHIAL NAVIGATION  WITH INDOCYANINE GREEN/METHYLENE BLUE MARKING INJECTION OF RIGHT UPPER LOBE MASS;  Surgeon: Grace Isaac, MD;  Location: [place];  Service: Thoracic;  Laterality: N/A;  Family Psychiatric History: Mother Schizoaffective Bipolar type  Family History:  Family History  Problem Relation Age of Onset   Hypertension Mother    Mental illness Mother    Depression Mother    Bipolar disorder Mother    Schizophrenia Mother    Heart disease Mother    Liver disease Mother    Alcohol abuse Mother    Drug abuse Mother    Obesity Mother    Asthma Mother    Migraines Mother    High blood pressure Father    Depression Father    Sleep apnea Father    Obesity Father     Asthma Father    Migraines Father    COPD Maternal Grandmother    Asthma Maternal Grandmother    Schizophrenia Maternal Grandfather    Bipolar disorder Maternal Grandfather    Asthma Maternal Grandfather    Seizures Paternal Grandmother    Atrial fibrillation Paternal Grandmother    Asthma Paternal Grandmother    Diabetes Paternal Grandfather    Congestive Heart Failure Paternal Grandfather    Asthma Paternal Grandfather     Social History:  Social History   Socioeconomic History   Marital status: Single    Spouse name: Not on file   Number of children: Not on file   Years of education: Not on file   Highest education level: Not on file  Occupational History   Occupation: Scientist, water quality  Tobacco Use   Smoking status: Never    Passive exposure: Yes   Smokeless tobacco: Never  Vaping Use   Vaping Use: Never used  Substance and Sexual Activity   Alcohol use: No   Drug use: No   Sexual activity: Never    Birth control/protection: Abstinence  Other Topics Concern   Not on file  Social History Narrative   Ashley Cantu received her GED.   She attended [employer].   She lives with her dad only.   She has a younger brother.   She works at [employer].   Social Determinants of Health   Financial Resource Strain: Not on file  Food Insecurity: Not on file  Transportation Needs: Not on file  Physical Activity: Not on file  Stress: Not on file  Social Connections: Not on file    Allergies:  Allergies  Allergen Reactions   Penicillins Anaphylaxis, Hives and Swelling    Has patient had a PCN reaction causing immediate rash, facial/tongue/throat swelling, SOB or lightheadedness with hypotension: yes Has patient had a PCN reaction causing severe rash involving mucus membranes or skin necrosis: no Has patient had a PCN reaction that required hospitalization: no Has patient had a PCN reaction occurring within the last 10 years: bo If all of the above answers are "NO", then may  proceed with Cephalosporin use.     Metabolic Disorder Labs: Lab Results  Component Value Date   HGBA1C 5.3 04/24/2020   No results found for: PROLACTIN Lab Results  Component Value Date   CHOL 155 04/24/2020   TRIG 107 (H) 04/24/2020   HDL 45 04/24/2020   CHOLHDL 3.4 04/24/2020   [place] 90 04/24/2020   LDLCALC 78 10/17/2019   Lab Results  Component Value Date   TSH 1.410 04/24/2020   TSH 1.060 10/17/2019    Therapeutic Level Labs: No results found for: LITHIUM No results found for: VALPROATE No components found for:  CBMZ  Current Medications: Current Outpatient Medications  Medication Sig Dispense Refill   albuterol (VENTOLIN HFA) 108 (90 Base) MCG/ACT  inhaler Inhale 2 puffs into the lungs every 6 (six) hours as needed for wheezing or shortness of breath. 18 g 1   cetirizine (ZYRTEC) 10 MG tablet Take 1 tablet (10 mg total) by mouth daily as needed. 90 tablet 1   esomeprazole (NEXIUM) 40 MG capsule Take 1 capsule (40 mg total) by mouth daily. 90 capsule 1   fluticasone (FLONASE) 50 MCG/ACT nasal spray Place 2 sprays into both nostrils daily as needed. 18.2 mL 1   lamoTRIgine (LAMICTAL) 100 MG tablet Take 1 tablet (100 mg total) by mouth daily. 30 tablet 3   montelukast (SINGULAIR) 10 MG tablet Take 1 tablet (10 mg total) by mouth at bedtime. 30 tablet 11   traZODone (DESYREL) 50 MG tablet Take 1 tablet (50 mg total) by mouth at bedtime. 30 tablet 3   Vitamin D, Ergocalciferol, (DRISDOL) 1.25 MG (50000 UNIT) CAPS capsule Take 50,000 Units by mouth every 7 (seven) days.     No current facility-administered medications for this visit.     Musculoskeletal: Strength & Muscle Tone:  Unable to assess due to telephone visit Gait & Station: normal, Unable to assess due to telephone visit Patient leans: N/A  Psychiatric Specialty Exam: Review of Systems  There were no vitals taken for this visit.There is no height or weight on file to calculate BMI.  General Appearance:   Unable to assess due to telephone visit  Eye Contact:   Unable to assess due to telephone visit  Speech:  Clear and Coherent and Normal Rate  Volume:  Normal  Mood:  Euthymic  Affect:  Appropriate and Congruent  Thought Process:  Coherent, Goal Directed and Linear  Orientation:  Full (Time, Place, and Person)  Thought Content: WDL and Logical   Suicidal Thoughts:  No  Homicidal Thoughts:  No  Memory:  Immediate;   Good Recent;   Good Remote;   Good  Judgement:  Good  Insight:  Good  Psychomotor Activity:  Normal  Concentration:  Concentration: Good and Attention Span: Good  Recall:  Good  Fund of Knowledge: Good  Language: Good  Akathisia:  No  Handed:  Right  AIMS (if indicated): Not done  Assets:  Communication Skills Desire for Improvement Financial Resources/Insurance Housing Social Support  ADL's:  Intact  Cognition: WNL  Sleep:  Good   Screenings: GAD-7    Flowsheet Row Video Visit from 02/21/2022 in [facility] Office Visit from 12/10/2021 in [facility] Video Visit from 09/12/2021 in [facility] Video Visit from 06/12/2021 in [facility] Video Visit from 03/12/2021 in [facility]  Total GAD-7 Score '1 4 3 1 2      '$ PHQ2-9    Flowsheet Row Video Visit from 02/21/2022 in [facility] Office Visit from 12/10/2021 in [facility] Video Visit from 09/12/2021 in [facility] Video Visit from 06/12/2021 in [facility] Video Visit from 03/12/2021 in [facility]  PHQ-2 Total Score 0 0 '1 1 1  '$ PHQ-9 Total Score '2 12 3 3 2      '$ Flowsheet Row Video Visit from 03/12/2021 in [facility] Most recent reading at 03/12/2021 12:10 PM ED from  03/12/2021 in [facility] DEPT Most recent reading at 03/12/2021  5:36 AM Video Visit from 12/13/2020 in [facility]  Center Most recent reading at 12/13/2020  3:41 PM  C-SSRS RISK CATEGORY No Risk No Risk No Risk        Assessment and Plan: Patient notes that she is doing well on her current medication regimen. At this time patient does not want to restart hydroxyzine.  She will continue Lamictal and trazodone as prescribed.  1. Bipolar 2 disorder, major depressive episode (HCC) Continue- lamoTRIgine (LAMICTAL) 100 MG tablet; Take 1 tablet (100 mg total) by mouth daily.  Dispense: 30 tablet; Refill: 3 Continue- traZODone (DESYREL) 50 MG tablet; Take 1 tablet (50 mg total) by mouth at bedtime.  Dispense: 30 tablet; Refill: 3    Follow-up in 3 month Follow-up with therapy  Salley Slaughter, NP 02/21/2022, 8:50 AM

## 2022-03-05 ENCOUNTER — Other Ambulatory Visit (HOSPITAL_BASED_OUTPATIENT_CLINIC_OR_DEPARTMENT_OTHER): Payer: Self-pay

## 2022-03-05 ENCOUNTER — Ambulatory Visit (HOSPITAL_BASED_OUTPATIENT_CLINIC_OR_DEPARTMENT_OTHER): Payer: Medicaid Other

## 2022-03-05 DIAGNOSIS — E559 Vitamin D deficiency, unspecified: Secondary | ICD-10-CM

## 2022-03-05 DIAGNOSIS — Z0000 Encounter for general adult medical examination without abnormal findings: Secondary | ICD-10-CM

## 2022-03-06 LAB — VITAMIN D 25 HYDROXY (VIT D DEFICIENCY, FRACTURES): Vit D, 25-Hydroxy: 12 ng/mL — ABNORMAL LOW (ref 30.0–100.0)

## 2022-03-06 LAB — COMPREHENSIVE METABOLIC PANEL
ALT: 14 IU/L (ref 0–32)
AST: 12 IU/L (ref 0–40)
Albumin/Globulin Ratio: 1.6 (ref 1.2–2.2)
Albumin: 4.1 g/dL (ref 3.9–5.0)
Alkaline Phosphatase: 95 IU/L (ref 42–106)
BUN/Creatinine Ratio: 18 (ref 9–23)
BUN: 11 mg/dL (ref 6–20)
Bilirubin Total: 0.3 mg/dL (ref 0.0–1.2)
CO2: 21 mmol/L (ref 20–29)
Calcium: 8.9 mg/dL (ref 8.7–10.2)
Chloride: 108 mmol/L — ABNORMAL HIGH (ref 96–106)
Creatinine, Ser: 0.6 mg/dL (ref 0.57–1.00)
Globulin, Total: 2.6 g/dL (ref 1.5–4.5)
Glucose: 96 mg/dL (ref 70–99)
Potassium: 5.1 mmol/L (ref 3.5–5.2)
Sodium: 143 mmol/L (ref 134–144)
Total Protein: 6.7 g/dL (ref 6.0–8.5)
eGFR: 133 mL/min/{1.73_m2} (ref >59)

## 2022-03-06 LAB — CBC WITH DIFFERENTIAL/PLATELET
Basophils Absolute: 0 10*3/uL (ref 0.0–0.2)
Basos: 1 %
EOS (ABSOLUTE): 0.2 10*3/uL (ref 0.0–0.4)
Eos: 2 %
Hematocrit: 39 % (ref 34.0–46.6)
Hemoglobin: 12.2 g/dL (ref 11.1–15.9)
Immature Grans (Abs): 0 10*3/uL (ref 0.0–0.1)
Immature Granulocytes: 0 %
Lymphocytes Absolute: 2.1 10*3/uL (ref 0.7–3.1)
Lymphs: 27 %
MCH: 25 pg — ABNORMAL LOW (ref 26.6–33.0)
MCHC: 31.3 g/dL — ABNORMAL LOW (ref 31.5–35.7)
MCV: 80 fL (ref 79–97)
Monocytes Absolute: 0.8 10*3/uL (ref 0.1–0.9)
Monocytes: 10 %
Neutrophils Absolute: 4.8 10*3/uL (ref 1.4–7.0)
Neutrophils: 60 %
Platelets: 257 10*3/uL (ref 150–450)
RBC: 4.88 x10E6/uL (ref 3.77–5.28)
RDW: 13.3 % (ref 11.7–15.4)
WBC: 8 10*3/uL (ref 3.4–10.8)

## 2022-03-06 LAB — LIPID PANEL
Chol/HDL Ratio: 3.5 ratio (ref 0.0–4.4)
Cholesterol, Total: 142 mg/dL (ref 100–169)
HDL: 41 mg/dL (ref >39)
LDL Chol Calc (NIH): 79 mg/dL (ref 0–109)
Triglycerides: 121 mg/dL — ABNORMAL HIGH (ref 0–89)
VLDL Cholesterol Cal: 22 mg/dL (ref 5–40)

## 2022-03-06 LAB — HEMOGLOBIN A1C
Est. average glucose Bld gHb Est-mCnc: 108 mg/dL
Hgb A1c MFr Bld: 5.4 % (ref 4.8–5.6)

## 2022-03-06 LAB — TSH RFX ON ABNORMAL TO FREE T4: TSH: 2.88 u[IU]/mL (ref 0.450–4.500)

## 2022-03-12 ENCOUNTER — Ambulatory Visit (INDEPENDENT_AMBULATORY_CARE_PROVIDER_SITE_OTHER): Payer: Medicaid Other | Admitting: Family Medicine

## 2022-03-12 ENCOUNTER — Encounter (HOSPITAL_BASED_OUTPATIENT_CLINIC_OR_DEPARTMENT_OTHER): Payer: Self-pay | Admitting: Family Medicine

## 2022-03-12 DIAGNOSIS — Z0000 Encounter for general adult medical examination without abnormal findings: Secondary | ICD-10-CM | POA: Diagnosis not present

## 2022-03-12 DIAGNOSIS — K219 Gastro-esophageal reflux disease without esophagitis: Secondary | ICD-10-CM

## 2022-03-12 DIAGNOSIS — E559 Vitamin D deficiency, unspecified: Secondary | ICD-10-CM

## 2022-03-12 MED ORDER — ESOMEPRAZOLE MAGNESIUM 40 MG PO CPDR: 40.0000 mg | DELAYED_RELEASE_CAPSULE | Freq: Every day | ORAL | 1 refills | Status: DC

## 2022-03-12 MED ORDER — VITAMIN D (ERGOCALCIFEROL) 1.25 MG (50000 UNIT) PO CAPS: 50000.0000 [IU] | ORAL_CAPSULE | ORAL | 0 refills | Status: DC

## 2022-03-12 NOTE — Assessment & Plan Note (Signed)
Routine HCM labs reviewed. HCM reviewed/discussed. Anticipatory guidance regarding healthy weight, lifestyle and choices given. Recommend healthy diet.  Recommend approximately 150 minutes/week of moderate intensity exercise Recommend regular dental and vision exams Always use seatbelt/lap and shoulder restraints Recommend using smoke alarms and checking batteries at least twice a year Recommend using sunscreen when outside 

## 2022-03-12 NOTE — Progress Notes (Signed)
Subjective:    CC: Annual Physical Exam  HPI:  Ashley Cantu is a 21 y.o. presenting for annual physical  I reviewed the past medical history, family history, social history, surgical history, and allergies today and no changes were needed.  Please see the problem list section below in epic for further details.  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   Asthma when she was a baby   Bone spur    Bronchiectasis ([place])    Cholelithiasis 04/01/2017   Cough 10/15/2018   Depression    Environmental allergies    Gallbladder problem    GERD (gastroesophageal reflux disease)    Headache    Heartburn    IBS (irritable bowel syndrome)    Joint pain    Lactose intolerance    Migraine variant    Obesity    PONV (postoperative nausea and vomiting)    with the rhinoplasty surgery   Vitamin D deficiency    Past Surgical History: Past Surgical History:  Procedure Laterality Date   CHOLECYSTECTOMY N/A 04/01/2017   Procedure: LAPAROSCOPIC CHOLECYSTECTOMY;  Surgeon: Stanford Scotland, MD;  Location: [facility] OR;  Service: General;  Laterality: N/A;   RHINOPLASTY     VIDEO BRONCHOSCOPY WITH ENDOBRONCHIAL NAVIGATION N/A 07/11/2020   Procedure: VIDEO BRONCHOSCOPY WITH ENDOBRONCHIAL NAVIGATION  WITH INDOCYANINE GREEN/METHYLENE BLUE MARKING INJECTION OF RIGHT UPPER LOBE MASS;  Surgeon: Grace Isaac, MD;  Location: [facility] OR;  Service: Thoracic;  Laterality: N/A;   Social History: Social History   Socioeconomic History   Marital status: Single    Spouse name: Not on file   Number of children: Not on file   Years of education: Not on file   Highest education level: Not on file  Occupational History   Occupation: Scientist, water quality  Tobacco Use   Smoking status: Never    Passive exposure: Yes   Smokeless tobacco: Never  Vaping  Use   Vaping Use: Never used  Substance and Sexual Activity   Alcohol use: No   Drug use: No   Sexual activity: Never    Birth control/protection: Abstinence  Other Topics Concern   Not on file  Social History Narrative   Ashley Cantu received her GED.   She attended [employer].   She lives with her dad only.   She has a younger brother.   She works at [employer].   Social Determinants of Health   Financial Resource Strain: Low Risk  (05/11/2020)   Overall Financial Resource Strain (CARDIA)    Difficulty of Paying Living Expenses: Not hard at all  Food Insecurity: No Food Insecurity (05/11/2020)   Hunger Vital Sign    Worried About Running Out of Food in the Last Year: Never true    [place] in the Last Year: Never true  Transportation Needs: No Transportation Needs (05/11/2020)   PRAPARE - Hydrologist (Medical): No    Lack of Transportation (Non-Medical): No  Physical Activity: Insufficiently Active (05/11/2020)   Exercise Vital Sign    Days of Exercise per Week: 1 day    Minutes of Exercise per Session: 60 min  Stress: Stress Concern Present (05/11/2020)   [place]    Feeling of  Stress : To some extent  Social Connections: Socially Isolated (05/11/2020)   Social Connection and Isolation Panel [NHANES]    Frequency of Communication with Friends and Family: Never    Frequency of Social Gatherings with Friends and Family: Once a week    Attends Religious Services: Never    Marine scientist or Organizations: No    Attends Music therapist: Never    Marital Status: Never married   Family History: Family History  Problem Relation Age of Onset   Hypertension Mother    Mental illness Mother    Depression Mother    Bipolar disorder Mother    Schizophrenia Mother    Heart disease Mother    Liver disease Mother    Alcohol abuse Mother    Drug abuse Mother     Obesity Mother    Asthma Mother    Migraines Mother    High blood pressure Father    Depression Father    Sleep apnea Father    Obesity Father    Asthma Father    Migraines Father    COPD Maternal Grandmother    Asthma Maternal Grandmother    Schizophrenia Maternal Grandfather    Bipolar disorder Maternal Grandfather    Asthma Maternal Grandfather    Seizures Paternal Grandmother    Atrial fibrillation Paternal Grandmother    Asthma Paternal Grandmother    Diabetes Paternal Grandfather    Congestive Heart Failure Paternal Grandfather    Asthma Paternal Grandfather    Allergies: Allergies  Allergen Reactions   Penicillins Anaphylaxis, Hives and Swelling    Has patient had a PCN reaction causing immediate rash, facial/tongue/throat swelling, SOB or lightheadedness with hypotension: yes Has patient had a PCN reaction causing severe rash involving mucus membranes or skin necrosis: no Has patient had a PCN reaction that required hospitalization: no Has patient had a PCN reaction occurring within the last 10 years: bo If all of the above answers are "NO", then may proceed with Cephalosporin use.    Medications: See med rec.  Review of Systems: No headache, visual changes, nausea, vomiting, diarrhea, constipation, dizziness, abdominal pain, skin rash, fevers, chills, night sweats, swollen lymph nodes, weight loss, chest pain, body aches, joint swelling, muscle aches, shortness of breath, mood changes, visual or auditory hallucinations.  Objective:    BP 138/80   Pulse 90   Temp 97.6 F (36.4 C) (Oral)   Ht 5' 7.7" (1.72 m)   Wt (!) 481 lb 1.6 oz (218.2 kg)   SpO2 99%   BMI 73.80 kg/m   General: Well Developed, well nourished, and in no acute distress.  Neuro: Alert and oriented x3, extra-ocular muscles intact, sensation grossly intact. Cranial nerves II through XII are intact, motor, sensory, and coordinative functions are all intact. HEENT: Normocephalic, atraumatic,  pupils equal round reactive to light, neck supple, no masses, no lymphadenopathy, thyroid nonpalpable. Oropharynx, nasopharynx, external ear canals are unremarkable. Skin: Warm and dry, no rashes noted.  Cardiac: Regular rate and rhythm, no murmurs rubs or gallops.  Respiratory: Clear to auscultation bilaterally. Not using accessory muscles, speaking in full sentences.  Abdominal: Soft, nontender, nondistended, positive bowel sounds, no masses, no organomegaly.  Musculoskeletal: Shoulder, elbow, wrist, hip, knee, ankle stable, and with full range of motion.  Impression and Recommendations:    Wellness examination Routine HCM labs reviewed. HCM reviewed/discussed. Anticipatory guidance regarding healthy weight, lifestyle and choices given. Recommend healthy diet.  Recommend approximately 150 minutes/week of moderate intensity exercise  Recommend regular dental and vision exams Always use seatbelt/lap and shoulder restraints Recommend using smoke alarms and checking batteries at least twice a year Recommend using sunscreen when outside  Vitamin D deficiency Vitamin D notably low on recent labs, found to be 12 She has been on 50,000 unit weekly dose in the past, has not taken this for at least 4 months.  Will have patient resume weekly 50,000 unit dose, refill sent to pharmacy Instructed on taking this for about 2 months and then would transition to lower daily dose of 1000 units and plan to recheck vitamin D levels to determine response  Had HPV completed in 2015-2016 with pediatrician  Return in about 3 months (around 06/12/2022) for Vit D deficiency.   ___________________________________________ Betzabe Bevans de [place], MD, ABFM, CAQSM Primary Care and [place]

## 2022-03-12 NOTE — Assessment & Plan Note (Signed)
Vitamin D notably low on recent labs, found to be 12 She has been on 50,000 unit weekly dose in the past, has not taken this for at least 4 months.  Will have patient resume weekly 50,000 unit dose, refill sent to pharmacy Instructed on taking this for about 2 months and then would transition to lower daily dose of 1000 units and plan to recheck vitamin D levels to determine response

## 2022-04-30 ENCOUNTER — Encounter (INDEPENDENT_AMBULATORY_CARE_PROVIDER_SITE_OTHER): Payer: Self-pay

## 2022-05-22 ENCOUNTER — Encounter (HOSPITAL_COMMUNITY): Payer: Self-pay | Admitting: Psychiatry

## 2022-05-22 ENCOUNTER — Telehealth (INDEPENDENT_AMBULATORY_CARE_PROVIDER_SITE_OTHER): Payer: Medicaid Other | Admitting: Psychiatry

## 2022-05-22 DIAGNOSIS — F3181 Bipolar II disorder: Secondary | ICD-10-CM

## 2022-05-22 MED ORDER — TRAZODONE HCL 50 MG PO TABS: 50.0000 mg | ORAL_TABLET | Freq: Every day | ORAL | 3 refills | Status: DC

## 2022-05-22 MED ORDER — LAMOTRIGINE 100 MG PO TABS: 100.0000 mg | ORAL_TABLET | Freq: Every day | ORAL | 3 refills | Status: DC

## 2022-05-22 NOTE — Progress Notes (Signed)
[facility] MD/PA/NP OP Progress Note Virtual Visit via Telephone Note  I connected with Ashley Cantu on 05/22/22 at  4:00 PM EDT by telephone and verified that I am speaking with the correct person using two identifiers.  Location: Patient: home Provider: Clinic   I discussed the limitations, risks, security and privacy concerns of performing an evaluation and management service by telephone and the availability of in person appointments. I also discussed with the patient that there may be a patient responsible charge related to this service. The patient expressed understanding and agreed to proceed.   I provided  30 minutes of non-face-to-face time during this encounter.   05/22/2022 10:46 AM Ashley Cantu  MRN:  [id number]  Chief Complaint: "I have rate myself as 8 out of 10.  I am doing good"  HPI:20 y/o female seen today for follow up psychiatric evaluation. She has a psychiatric history of GAD, ADHD, Bipolar II disorder and OCD. Patient is currently prescribed Lamictal 100 mg daily, and trazodone '50mg'$  nightly. She reports that she has discontinued hydroxyzine and reports that her other medications are effective in managing her psychiatric conditions.  Today patient was unable to login virtually so her exam was done over the phone. During assessment she was  pleasant, cooperative, and engaged in conversation. She informed Probation officer that she rates herself as an 8 out of 10.  She notes that she is doing well.  She informed Probation officer that her mood is stable and reports having minimal anxiety and depression.  Provider conducted a GAD-7 and patient scored a 3.  Provider also conducted PHQ-9 patient scored a 1.  She endorses adequate sleep and appetite.  Today she denies SI/HI/VH, mania, paranoia.    No medication changes made today.  Patient agreeable to continue medication as prescribed.  Visit Diagnosis:    ICD-10-CM   1. 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   Asthma when she was a baby   Bone spur    Bronchiectasis ([place])    Cholelithiasis 04/01/2017   Cough 10/15/2018   Depression    Environmental allergies    Gallbladder problem    GERD (gastroesophageal reflux disease)    Headache    Heartburn    IBS (irritable bowel syndrome)    Joint pain    Lactose intolerance    Migraine variant    Obesity    PONV (postoperative nausea and vomiting)    with the rhinoplasty surgery   Vitamin D deficiency     Past Surgical History:  Procedure Laterality Date   CHOLECYSTECTOMY N/A 04/01/2017   Procedure: LAPAROSCOPIC CHOLECYSTECTOMY;  Surgeon: Stanford Scotland, MD;  Location: [facility];  Service: General;  Laterality: N/A;   RHINOPLASTY     VIDEO BRONCHOSCOPY WITH ENDOBRONCHIAL NAVIGATION N/A 07/11/2020   Procedure: VIDEO BRONCHOSCOPY WITH ENDOBRONCHIAL NAVIGATION  WITH INDOCYANINE GREEN/METHYLENE BLUE MARKING INJECTION OF RIGHT UPPER LOBE MASS;  Surgeon: Grace Isaac, MD;  Location: [facility];  Service: Thoracic;  Laterality: N/A;    Family Psychiatric History: Mother Schizoaffective Bipolar type  Family History:  Family History  Problem Relation Age of Onset   Hypertension Mother    Mental illness Mother    Depression Mother    Bipolar disorder Mother    Schizophrenia Mother    Heart disease Mother    Liver disease Mother    Alcohol abuse Mother    Drug abuse Mother    Obesity Mother    Asthma Mother    Migraines Mother    High blood pressure Father    Depression Father    Sleep apnea Father    Obesity Father    Asthma Father    Migraines Father    COPD Maternal Grandmother     Asthma Maternal Grandmother    Schizophrenia Maternal Grandfather    Bipolar disorder Maternal Grandfather    Asthma Maternal Grandfather    Seizures Paternal Grandmother    Atrial fibrillation Paternal Grandmother    Asthma Paternal Grandmother    Diabetes Paternal Grandfather    Congestive Heart Failure Paternal Grandfather    Asthma Paternal Grandfather     Social History:  Social History   Socioeconomic History   Marital status: Single    Spouse name: Not on file   Number of children: Not on file   Years of education: Not on file   Highest education level: Not on file  Occupational History   Occupation: Scientist, water quality  Tobacco Use   Smoking status: Never    Passive exposure: Yes   Smokeless tobacco: Never  Vaping Use   Vaping Use: Never used  Substance and Sexual Activity   Alcohol use: No   Drug use: No   Sexual activity: Never    Birth control/protection: Abstinence  Other Topics Concern   Not on file  Social History Narrative   [place] received her GED.   She attended [employer].   She lives with her dad only.   She has a younger brother.   She works at [employer].   Social Determinants of Health   Financial Resource Strain: Low Risk  (05/11/2020)   Overall Financial Resource Strain (CARDIA)    Difficulty of Paying Living Expenses: Not hard at all  Food Insecurity: No Food Insecurity (05/11/2020)   Hunger Vital Sign    Worried About Running Out of Food in the Last Year: Never true    [place] in the Last Year: Never true  Transportation Needs: No Transportation Needs (05/11/2020)   PRAPARE - Hydrologist (Medical): No    Lack of Transportation (Non-Medical): No  Physical Activity: Insufficiently Active (05/11/2020)   Exercise Vital Sign    Days of Exercise per Week: 1 day    Minutes of Exercise per Session: 60 min  Stress: Stress Concern Present (05/11/2020)   [place]    Feeling of Stress : To some extent  Social Connections: Socially Isolated (05/11/2020)   Social Connection and Isolation Panel [NHANES]    Frequency of Communication with Friends and Family: Never    Frequency of Social Gatherings with Friends and Family: Once a week    Attends Religious Services: Never    Marine scientist or Organizations: No    Attends Archivist Meetings: Never    Marital Status: Never married    Allergies:  Allergies  Allergen Reactions   Penicillins Anaphylaxis, Hives and Swelling    Has patient had a PCN reaction causing immediate rash, facial/tongue/throat swelling, SOB or lightheadedness  with hypotension: yes Has patient had a PCN reaction causing severe rash involving mucus membranes or skin necrosis: no Has patient had a PCN reaction that required hospitalization: no Has patient had a PCN reaction occurring within the last 10 years: bo If all of the above answers are "NO", then may proceed with Cephalosporin use.     Metabolic Disorder Labs: Lab Results  Component Value Date   HGBA1C 5.4 03/05/2022   No results found for: "PROLACTIN" Lab Results  Component Value Date   CHOL 142 03/05/2022   TRIG 121 (H) 03/05/2022   HDL 41 03/05/2022   CHOLHDL 3.5 03/05/2022   LDLCALC 79 03/05/2022   LDLCALC 90 04/24/2020   Lab Results  Component Value Date   TSH 2.880 03/05/2022   TSH 1.410 04/24/2020    Therapeutic Level Labs: No results found for: "LITHIUM" No results found for: "VALPROATE" No results found for: "CBMZ"  Current Medications: Current Outpatient Medications  Medication Sig Dispense Refill   albuterol (VENTOLIN HFA) 108 (90 Base) MCG/ACT inhaler Inhale 2 puffs into the lungs every 6 (six) hours as needed for wheezing or shortness of breath. 18 g 1   cetirizine (ZYRTEC) 10 MG tablet Take 1 tablet (10 mg total) by mouth daily as needed. 90 tablet 1   esomeprazole (NEXIUM) 40 MG capsule Take 1 capsule (40 mg  total) by mouth daily. 90 capsule 1   fluticasone (FLONASE) 50 MCG/ACT nasal spray Place 2 sprays into both nostrils daily as needed. 18.2 mL 1   lamoTRIgine (LAMICTAL) 100 MG tablet Take 1 tablet (100 mg total) by mouth daily. 30 tablet 3   montelukast (SINGULAIR) 10 MG tablet Take 1 tablet (10 mg total) by mouth at bedtime. 30 tablet 11   traZODone (DESYREL) 50 MG tablet Take 1 tablet (50 mg total) by mouth at bedtime. 30 tablet 3   Vitamin D, Ergocalciferol, (DRISDOL) 1.25 MG (50000 UNIT) CAPS capsule Take 1 capsule (50,000 Units total) by mouth every 7 (seven) days. 10 capsule 0   No current facility-administered medications for this visit.     Musculoskeletal: Strength & Muscle Tone:  Unable to assess due to telephone visit Gait & Station: normal, Unable to assess due to telephone visit Patient leans: N/A  Psychiatric Specialty Exam: Review of Systems  There were no vitals taken for this visit.There is no height or weight on file to calculate BMI.  General Appearance:  Unable to assess due to telephone visit  Eye Contact:   Unable to assess due to telephone visit  Speech:  Clear and Coherent and Normal Rate  Volume:  Normal  Mood:  Euthymic  Affect:  Appropriate and Congruent  Thought Process:  Coherent, Goal Directed and Linear  Orientation:  Full (Time, Place, and Person)  Thought Content: WDL and Logical   Suicidal Thoughts:  No  Homicidal Thoughts:  No  Memory:  Immediate;   Good Recent;   Good Remote;   Good  Judgement:  Good  Insight:  Good  Psychomotor Activity:  Normal  Concentration:  Concentration: Good and Attention Span: Good  Recall:  Good  Fund of Knowledge: Good  Language: Good  Akathisia:  No  Handed:  Right  AIMS (if indicated): Not done  Assets:  Communication Skills Desire for Improvement Financial Resources/Insurance Housing Social Support  ADL's:  Intact  Cognition: WNL  Sleep:  Good   Screenings: GAD-7    Flowsheet Row Video Visit from  05/22/2022 in [facility] Video  Visit from 02/21/2022 in [facility] Office Visit from 12/10/2021 in [place] [facility] Video Visit from 09/12/2021 in [facility] Video Visit from 06/12/2021 in [facility]  Total GAD-7 Score '3 1 4 3 1      '$ PHQ2-9    Flowsheet Row Video Visit from 05/22/2022 in [facility] Office Visit from 03/12/2022 in [place] [facility] Video Visit from 02/21/2022 in [facility] Office Visit from 12/10/2021 in [place] [facility] Video Visit from 09/12/2021 in [facility]  PHQ-2 Total Score 0 1 0 0 1  PHQ-9 Total Score '1 5 2 12 3      '$ Flowsheet Row Video Visit from 03/12/2021 in [facility] Most recent reading at 03/12/2021 12:10 PM ED from 03/12/2021 in [facility] DEPT Most recent reading at 03/12/2021  5:36 AM Video Visit from 12/13/2020 in [facility] Most recent reading at 12/13/2020  3:41 PM  C-SSRS RISK CATEGORY No Risk No Risk No Risk        Assessment and Plan: Patient notes that she is doing well on her current medication regimen. No medication changes made today. Patient agreeable to continue medications as prescribed.   1. Bipolar 2 disorder, major depressive episode (HCC) Continue- lamoTRIgine (LAMICTAL) 100 MG tablet; Take 1 tablet (100 mg total) by mouth daily.  Dispense: 30 tablet; Refill: 3 Continue- traZODone (DESYREL) 50 MG tablet; Take 1 tablet (50 mg total) by mouth at bedtime.  Dispense: 30 tablet; Refill: 3    Follow-up in 3 month Follow-up with therapy  Salley Slaughter, NP 05/22/2022, 10:46 AM

## 2022-06-03 ENCOUNTER — Other Ambulatory Visit (HOSPITAL_BASED_OUTPATIENT_CLINIC_OR_DEPARTMENT_OTHER): Payer: Self-pay | Admitting: Family Medicine

## 2022-06-03 DIAGNOSIS — K219 Gastro-esophageal reflux disease without esophagitis: Secondary | ICD-10-CM

## 2022-06-12 ENCOUNTER — Encounter (HOSPITAL_BASED_OUTPATIENT_CLINIC_OR_DEPARTMENT_OTHER): Payer: Self-pay | Admitting: Family Medicine

## 2022-06-12 ENCOUNTER — Ambulatory Visit (HOSPITAL_BASED_OUTPATIENT_CLINIC_OR_DEPARTMENT_OTHER): Payer: Medicaid Other | Admitting: Family Medicine

## 2022-06-12 VITALS — BP 144/98 | HR 82 | Ht 67.7 in | Wt >= 6400 oz

## 2022-06-12 DIAGNOSIS — Z23 Encounter for immunization: Secondary | ICD-10-CM | POA: Diagnosis not present

## 2022-06-12 DIAGNOSIS — E559 Vitamin D deficiency, unspecified: Secondary | ICD-10-CM

## 2022-06-12 NOTE — Progress Notes (Signed)
    Procedures performed today:    None.  Independent interpretation of notes and tests performed by another provider:   None.  Brief History, Exam, Impression, and Recommendations:    BP (!) 144/98   Pulse 82   Ht 5' 7.7" (1.72 m)   Wt (!) 482 lb (218.6 kg)   SpO2 100%   BMI 73.94 kg/m   Vitamin D deficiency Patient presents for follow-up of vitamin D deficiency.  Most recent lab was notably deficient at 21.  She has been taking vitamin D 50,000 units weekly, has been tolerating without issue.  Reports that she has been taking this dose for at least the past 4 to 6 months. We will check vitamin D level today to assess progress with supplementation and need for either continuing with same dose for making dose adjustment based on vitamin D level found on labs today  Return in about 6 months (around 12/11/2022). She would like to proceed with seasonal influenza vaccination today, administered in office today   ___________________________________________ Guerin Lashomb de [place], MD, ABFM, CAQSM Primary Care and [place]

## 2022-06-12 NOTE — Patient Instructions (Signed)
  Medication Instructions:  Your physician recommends that you continue on your current medications as directed. Please refer to the Current Medication list given to you today. --If you need a refill on any your medications before your next appointment, please call your pharmacy first. If no refills are authorized on file call the office.-- Lab Work: Your physician has recommended that you have lab work today: No If you have labs (blood work) drawn today and your tests are completely normal, you will receive your results via MyChart message OR a phone call from our staff.  Please ensure you check your voicemail in the event that you authorized detailed messages to be left on a delegated number. If you have any lab test that is abnormal or we need to change your treatment, we will call you to review the results.  Referrals/Procedures/Imaging: No  Follow-Up: Your next appointment:   Your physician recommends that you schedule a follow-up appointment in: 4-6 months with Dr. de [place].  You will receive a text message or e-mail with a link to a survey about your care and experience with us today! We would greatly appreciate your feedback!   Thanks for letting us be apart of your health journey!!  Primary Care and Sports Medicine   Dr. Raymond de [place]   We encourage you to activate your patient portal called "MyChart".  Sign up information is provided on this After Visit Summary.  MyChart is used to connect with patients for Virtual Visits (Telemedicine).  Patients are able to view lab/test results, encounter notes, upcoming appointments, etc.  Non-urgent messages can be sent to your provider as well. To learn more about what you can do with MyChart, please visit --  [email or web address].    

## 2022-06-12 NOTE — Assessment & Plan Note (Signed)
Patient presents for follow-up of vitamin D deficiency.  Most recent lab was notably deficient at 2.  She has been taking vitamin D 50,000 units weekly, has been tolerating without issue.  Reports that she has been taking this dose for at least the past 4 to 6 months. We will check vitamin D level today to assess progress with supplementation and need for either continuing with same dose for making dose adjustment based on vitamin D level found on labs today

## 2022-06-13 LAB — VITAMIN D 25 HYDROXY (VIT D DEFICIENCY, FRACTURES): Vit D, 25-Hydroxy: 25 ng/mL — ABNORMAL LOW (ref 30.0–100.0)

## 2022-06-17 ENCOUNTER — Telehealth: Payer: Medicaid Other | Admitting: Adult Health

## 2022-06-18 ENCOUNTER — Other Ambulatory Visit (HOSPITAL_BASED_OUTPATIENT_CLINIC_OR_DEPARTMENT_OTHER): Payer: Self-pay | Admitting: Family Medicine

## 2022-06-18 DIAGNOSIS — E559 Vitamin D deficiency, unspecified: Secondary | ICD-10-CM

## 2022-07-29 ENCOUNTER — Encounter (HOSPITAL_COMMUNITY): Payer: Self-pay | Admitting: Psychiatry

## 2022-07-29 ENCOUNTER — Ambulatory Visit (INDEPENDENT_AMBULATORY_CARE_PROVIDER_SITE_OTHER): Payer: Medicaid Other | Admitting: Psychiatry

## 2022-07-29 VITALS — BP 129/80 | HR 81 | Resp 13 | Ht 67.0 in | Wt >= 6400 oz

## 2022-07-29 DIAGNOSIS — F411 Generalized anxiety disorder: Secondary | ICD-10-CM | POA: Diagnosis not present

## 2022-07-29 DIAGNOSIS — F3181 Bipolar II disorder: Secondary | ICD-10-CM | POA: Diagnosis not present

## 2022-07-29 MED ORDER — HYDROXYZINE HCL 10 MG PO TABS: 10.0000 mg | ORAL_TABLET | Freq: Three times a day (TID) | ORAL | 3 refills | Status: DC | PRN

## 2022-07-29 MED ORDER — ARIPIPRAZOLE 5 MG PO TABS: 5.0000 mg | ORAL_TABLET | Freq: Every day | ORAL | 3 refills | Status: DC

## 2022-07-29 MED ORDER — TRAZODONE HCL 50 MG PO TABS: 50.0000 mg | ORAL_TABLET | Freq: Every day | ORAL | 3 refills | Status: DC

## 2022-07-29 NOTE — Progress Notes (Signed)
BH MD/PA/NP OP Progress Note    07/29/2022 3:13 PM Ashley Cantu  MRN:  [id number]  Chief Complaint: "Things are hectic"  HPI:21 y/o female seen today for follow up psychiatric evaluation. She has a psychiatric history of GAD, ADHD, Bipolar II disorder and OCD. Patient is currently prescribed Lamictal 100 mg daily, and trazodone '50mg'$  nightly. She reports that she has not taken Lamictal in a few months.  Today she was well-groomed, pleasant, cooperative, and engaged in conversation.  She informed Probation officer that she has not taken Lamictal in a few months.  Patient notes that life has been hectic.  She informed Probation officer that within the last 6 months 3 of her grandparents passed away.  She also notes that she and her father are the caregiver of her cousin who has cancer and has also had a few strokes in the past couple of months.  Patient notes that since being off of Lamictal she has been irritable, distractible, and has racing thoughts.  She denies impulsive behaviors.  Patient also notes that her anxiety and depression has increased since her last visit.  Provider conducted a GAD-7 and patient scored a 12, at her last visit she scored a 3.  Provider also conducted PHQ-9 and patient scored a 17, at her last visit she scored a 4.  She endorses fluctuations in appetite and sleep.  She endorses passive SI but denies wanting to harm herself.  Today she denies SI/HI/VAH or paranoia.  Patient reports that work is going well.  She works at a [employer].  Patient notes that while at the movie theater she hurt her knee.  She notes that at this time she does not have to take medications to help relieve the pain.  Patient informed that she does not wish to restart Lamictal.  Provider discussed mood stabilizers and antipsychotics to help manage mood.  Patient was agreeable to starting Abilify 5 mg to help manage mood.Potential side effects of medication and risks vs benefits of treatment vs non-treatment were  explained and discussed. All questions were answered.  She is also agreeable to restarting hydroxyzine 10 mg 3 times daily to help manage symptoms of anxiety.  Trazodone 50 mg nightly as needed restarted as well to help manage sleep.  Patient referred to outpatient counseling for therapy.  No other concerns at this time.   Visit Diagnosis:    ICD-10-CM   1. Generalized anxiety disorder  F41.1 traZODone (DESYREL) 50 MG tablet    hydrOXYzine (ATARAX) 10 MG tablet    Ambulatory referral to Social Work    2. 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   Asthma when she was a baby   Bone spur    Bronchiectasis ([place])    Cholelithiasis 04/01/2017   Cough 10/15/2018   Depression    Environmental allergies    Gallbladder problem    GERD (gastroesophageal reflux disease)    Headache    Heartburn    IBS (irritable bowel syndrome)    Joint pain  Lactose intolerance    Migraine variant    Obesity    PONV (postoperative nausea and vomiting)    with the rhinoplasty surgery   Vitamin D deficiency     Past Surgical History:  Procedure Laterality Date   CHOLECYSTECTOMY N/A 04/01/2017   Procedure: LAPAROSCOPIC CHOLECYSTECTOMY;  Surgeon: Stanford Scotland, MD;  Location: [facility];  Service: General;  Laterality: N/A;   RHINOPLASTY     VIDEO BRONCHOSCOPY WITH ENDOBRONCHIAL NAVIGATION N/A 07/11/2020   Procedure: VIDEO BRONCHOSCOPY WITH ENDOBRONCHIAL NAVIGATION  WITH INDOCYANINE GREEN/METHYLENE BLUE MARKING INJECTION OF RIGHT UPPER LOBE MASS;  Surgeon: Grace Isaac, MD;  Location: [facility];  Service: Thoracic;  Laterality: N/A;    Family Psychiatric History: Mother Schizoaffective Bipolar type  Family History:  Family History  Problem Relation Age of Onset   Hypertension Mother    Mental illness Mother    Depression Mother    Bipolar disorder Mother    Schizophrenia Mother    Heart disease Mother    Liver disease Mother    Alcohol abuse Mother    Drug abuse Mother    Obesity Mother    Asthma Mother    Migraines Mother    High blood pressure Father    Depression Father    Sleep apnea Father    Obesity Father    Asthma Father    Migraines Father    COPD Maternal Grandmother    Asthma Maternal Grandmother    Schizophrenia Maternal Grandfather    Bipolar disorder Maternal Grandfather    Asthma Maternal Grandfather    Seizures Paternal Grandmother    Atrial fibrillation Paternal Grandmother    Asthma Paternal Grandmother    Diabetes Paternal Grandfather    Congestive Heart Failure Paternal Grandfather    Asthma Paternal Grandfather     Social History:  Social History   Socioeconomic History   Marital status: Single    Spouse name: Not on file   Number of children: Not on file   Years of education: Not on file   Highest education level: Not on file  Occupational History   Occupation: Scientist, water quality  Tobacco Use   Smoking status: Never    Passive exposure: Yes   Smokeless tobacco: Never  Vaping Use   Vaping Use: Never used  Substance and Sexual Activity   Alcohol use: No   Drug use: No   Sexual activity: Never    Birth control/protection: Abstinence  Other Topics Concern   Not on file  Social History Narrative   [name] received her GED.   She attended [employer].   She lives with her dad only.   She has a younger brother.   She works at [employer].   Social Determinants of Health   Financial Resource Strain: Low Risk  (05/11/2020)   Overall Financial Resource Strain (CARDIA)    Difficulty of Paying Living Expenses: Not hard at all  Food  Insecurity: No Food Insecurity (05/11/2020)   Hunger Vital Sign    Worried About Running Out of Food in the Last Year: Never true    [place] in the Last Year: Never true  Transportation Needs: No Transportation Needs (05/11/2020)   PRAPARE - Hydrologist (Medical): No    Lack of Transportation (Non-Medical): No  Physical Activity: Insufficiently Active (05/11/2020)   Exercise Vital Sign    Days of Exercise per Week: 1 day    Minutes of Exercise per Session: 60 min  Stress: Stress Concern Present (05/11/2020)   [place]    Feeling of Stress : To some extent  Social Connections: Socially Isolated (05/11/2020)   Social Connection and Isolation Panel [NHANES]    Frequency of Communication with Friends and Family: Never    Frequency of Social Gatherings with Friends and Family: Once a week    Attends Religious Services: Never    Marine scientist or Organizations: No    Attends Archivist Meetings: Never    Marital Status: Never married    Allergies:  Allergies  Allergen Reactions   Penicillins Anaphylaxis, Hives and Swelling    Has patient had a PCN reaction causing immediate rash, facial/tongue/throat swelling, SOB or lightheadedness with hypotension: yes Has patient had a PCN reaction causing severe rash involving mucus membranes or skin necrosis: no Has patient had a PCN reaction that required hospitalization: no Has patient had a PCN reaction occurring within the last 10 years: bo If all of the above answers are "NO", then may proceed with Cephalosporin use.     Metabolic Disorder Labs: Lab Results  Component Value Date   HGBA1C 5.4 03/05/2022   No results found for: "PROLACTIN" Lab Results  Component Value Date   CHOL 142 03/05/2022   TRIG 121 (H) 03/05/2022   HDL 41 03/05/2022   CHOLHDL 3.5 03/05/2022   LDLCALC 79 03/05/2022   LDLCALC 90 04/24/2020    Lab Results  Component Value Date   TSH 2.880 03/05/2022   TSH 1.410 04/24/2020    Therapeutic Level Labs: No results found for: "LITHIUM" No results found for: "VALPROATE" No results found for: "CBMZ"  Current Medications: Current Outpatient Medications  Medication Sig Dispense Refill   ARIPiprazole (ABILIFY) 5 MG tablet Take 1 tablet (5 mg total) by mouth daily. 30 tablet 3   hydrOXYzine (ATARAX) 10 MG tablet Take 1 tablet (10 mg total) by mouth 3 (three) times daily as needed. 90 tablet 3   albuterol (VENTOLIN HFA) 108 (90 Base) MCG/ACT inhaler Inhale 2 puffs into the lungs every 6 (six) hours as needed for wheezing or shortness of breath. 18 g 1   cetirizine (ZYRTEC) 10 MG tablet Take 1 tablet (10 mg total) by mouth daily as needed. 90 tablet 1   esomeprazole (NEXIUM) 40 MG capsule TAKE 1 CAPSULE(40 MG) BY MOUTH DAILY 90 capsule 1   fluticasone (FLONASE) 50 MCG/ACT nasal spray Place 2 sprays into both nostrils daily as needed. 18.2 mL 1   montelukast (SINGULAIR) 10 MG tablet Take 1 tablet (10 mg total) by mouth at bedtime. 30 tablet 11   traZODone (DESYREL) 50 MG tablet Take 1 tablet (50 mg total) by mouth at bedtime. 30 tablet 3   Vitamin D, Ergocalciferol, (DRISDOL) 1.25 MG (50000 UNIT) CAPS capsule Take 1 capsule (50,000 Units total) by mouth every 7 (seven) days. 10 capsule 0   No current facility-administered medications for this visit.     Musculoskeletal: Strength & Muscle Tone: within normal limits Gait & Station: normal Patient leans: N/A  Psychiatric Specialty Exam: Review of Systems  Blood pressure 129/80, pulse 81, resp. rate 13, height '5\' 7"'$  (1.702 m), weight (!) 480 lb (217.7 kg), SpO2 100 %.Body mass index is 75.18 kg/m.  General Appearance: Well Groomed  Eye Contact:  Good  Speech:  Clear and Coherent and Normal Rate  Volume:  Normal  Mood:  Anxious and Depressed  Affect:  Appropriate and Congruent  Thought Process:  Coherent, Goal Directed and  Linear  Orientation:  Full (Time, Place, and Person)  Thought Content: WDL and Logical   Suicidal Thoughts:  Yes.  without intent/plan  Homicidal Thoughts:  No  Memory:  Immediate;   Good Recent;   Good Remote;   Good  Judgement:  Good  Insight:  Good  Psychomotor Activity:  Normal  Concentration:  Concentration: Good and Attention Span: Good  Recall:  Good  Fund of Knowledge: Good  Language: Good  Akathisia:  No  Handed:  Right  AIMS (if indicated): Not done  Assets:  Communication Skills Desire for Improvement Financial Resources/Insurance Housing Social Support  ADL's:  Intact  Cognition: WNL  Sleep:  Fair   Screenings: GAD-7    Flowsheet Row Clinical Support from 07/29/2022 in [facility] Office Visit from 06/12/2022 in [facility] Video Visit from 05/22/2022 in [facility] Video Visit from 02/21/2022 in [facility] Office Visit from 12/10/2021 in [place] [facility]  Total GAD-7 Score '12 3 3 1 4      '$ PHQ2-9    Flowsheet Row Clinical Support from 07/29/2022 in [facility] Office Visit from 06/12/2022 in [place] [facility] Video Visit from 05/22/2022 in [facility] Office Visit from 03/12/2022 in [place] [facility] Video Visit from 02/21/2022 in [facility]  PHQ-2 Total Score 4 2 0 1 0  PHQ-9 Total Score '17 4 1 5 2      '$ Flowsheet Row Clinical Support from 07/29/2022 in [facility] Most recent reading at 07/29/2022  2:51 PM Video Visit from 03/12/2021 in [facility] Most recent reading at 03/12/2021 12:10 PM ED from 03/12/2021 in [place] DEPT Most recent reading at  03/12/2021  5:36 AM  C-SSRS RISK CATEGORY Error: Q7 should not be populated when Q6 is No No Risk No Risk        Assessment and Plan: Patient endorses symptoms of hypomania, anxiety, and depression.Patient informed that she does not wish to restart Lamictal.  Provider discussed mood stabilizers and antipsychotics to help manage mood.  Patient was agreeable to starting Abilify 5 mg to help manage mood.  She is also agreeable to restarting hydroxyzine 10 mg 3 times daily to help manage symptoms of anxiety.  Trazodone 50 mg nightly as needed restarted as well to help manage sleep 1. Bipolar 2 disorder, major depressive episode (HCC)  Start- ARIPiprazole (ABILIFY) 5 MG tablet; Take 1 tablet (5 mg total) by mouth daily.  Dispense: 30 tablet; Refill: 3 Restart- traZODone (DESYREL) 50 MG tablet; Take 1 tablet (50 mg total) by mouth at bedtime.  Dispense: 30 tablet; Refill: 3 - Ambulatory referral to Social Work  2. Generalized anxiety disorder  Restart- traZODone (DESYREL) 50 MG tablet; Take 1 tablet (50 mg total) by mouth at bedtime.  Dispense: 30 tablet; Refill: 3 Restart- hydrOXYzine (ATARAX) 10 MG tablet; Take 1 tablet (10 mg total) by mouth 3 (three) times daily as needed.  Dispense: 90 tablet; Refill: 3 - Ambulatory referral to Social Work     Follow-up in 3 month Follow-up with therapy  Salley Slaughter, NP 07/29/2022, 3:13 PM

## 2022-08-19 ENCOUNTER — Encounter (HOSPITAL_COMMUNITY): Payer: Self-pay

## 2022-08-19 ENCOUNTER — Ambulatory Visit (INDEPENDENT_AMBULATORY_CARE_PROVIDER_SITE_OTHER): Payer: Medicaid Other | Admitting: Mental Health

## 2022-08-19 DIAGNOSIS — F3181 Bipolar II disorder: Secondary | ICD-10-CM | POA: Diagnosis not present

## 2022-08-19 DIAGNOSIS — F902 Attention-deficit hyperactivity disorder, combined type: Secondary | ICD-10-CM

## 2022-08-19 DIAGNOSIS — F411 Generalized anxiety disorder: Secondary | ICD-10-CM
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
## 2022-08-20 NOTE — Plan of Care (Signed)
  Problem: Bipolar Disorder CCP Problem  1 Bipolar Goal: LTG: Stabilize mood and increase goal-directed behavior AEB self report within the next 6 months  Outcome: Initial Goal: STG: Athenia increase management of moods(depressive/manic episodes) AEB development of effective coping skills with ability to reframe maladaptive thinking within the next 6 months  Outcome: Initial Goal: STG: Destine will increase management of stressors AEB development of effective decision making skills with ability to identify realistic vs unrealistic stressors within the next 6 months Outcome: Initial

## 2022-09-03 ENCOUNTER — Ambulatory Visit (INDEPENDENT_AMBULATORY_CARE_PROVIDER_SITE_OTHER): Payer: Medicaid Other | Admitting: Mental Health

## 2022-09-03 DIAGNOSIS — F3181 Bipolar II disorder: Secondary | ICD-10-CM

## 2022-09-03 DIAGNOSIS — F411 Generalized anxiety disorder: Secondary | ICD-10-CM

## 2022-09-03 DIAGNOSIS — F902 Attention-deficit hyperactivity disorder, combined type: Secondary | ICD-10-CM

## 2022-09-03 NOTE — Progress Notes (Unsigned)
   THERAPIST PROGRESS NOTE  Session Time: 2:06PM   Participation Level: Active  Behavioral Response: CasualAlert{[id number]}  Type of Therapy: Individual Therapy  Treatment Goals addressed: STG: Myrta increase management of moods(depressive/manic episodes) AEB development of effective coping skills with ability to reframe maladaptive thinking within the next 6 months   STG: Jamielynn will increase management of stressors AEB development of effective decision making skills with ability to identify realistic vs unrealistic stressors within the next 6 months    ProgressTowards Goals: Initial  Interventions: CBT and Supportive  Summary: Margaretann Abate is a 21 y.o. female who presents with ***.   Suicidal/Homicidal: Nowithout intent/plan  Therapist Response: ***  Plan: Return again in  X 4 (due to holiday) weeks.  Diagnosis: Bipolar 2 disorder, major depressive episode (HCC)  Generalized anxiety disorder  Attention deficit hyperactivity disorder (ADHD), combined type  Collaboration of Care: Other None  Patient/Guardian was advised Release of Information must be obtained prior to any record release in order to collaborate their care with an outside provider. Patient/Guardian was advised if they have not already done so to contact the registration department to sign all necessary forms in order for [place] to release information regarding their care.   Consent: Patient/Guardian gives verbal consent for treatment and assignment of benefits for services provided during this visit. Patient/Guardian expressed understanding and agreed to proceed.   Rockey Situ [place], [facility] 09/03/2022

## 2022-10-08 ENCOUNTER — Ambulatory Visit (INDEPENDENT_AMBULATORY_CARE_PROVIDER_SITE_OTHER): Payer: Medicaid Other | Admitting: Mental Health

## 2022-10-08 DIAGNOSIS — F3181 Bipolar II disorder: Secondary | ICD-10-CM | POA: Diagnosis not present

## 2022-10-08 DIAGNOSIS — F902 Attention-deficit hyperactivity disorder, combined type: Secondary | ICD-10-CM

## 2022-10-08 DIAGNOSIS — F411 Generalized anxiety disorder: Secondary | ICD-10-CM

## 2022-10-08 NOTE — Progress Notes (Signed)
   THERAPIST PROGRESS NOTE  Session Time: 1:04pm ( 47 minutes)  Participation Level: Active  Behavioral Response: CasualAlertEuthymic  Type of Therapy: Individual Therapy  Treatment Goals addressed: STG: [name] increase management of moods(depressive/manic episodes) AEB development of effective coping skills with ability to reframe maladaptive thinking within the next 6 months    STG: [name] will increase management of stressors AEB development of effective decision making skills with ability to identify realistic vs unrealistic stressors within the next 6 months   ProgressTowards Goals: Progressing  Interventions: Supportive  Summary: Ashley Cantu is a 22 y.o. female who presents with dx of bipolar disorder, GAD and ADHD. [name] presents alert and oriented. Smiles and laughs. Engaged and receptive to interventions. Mood and affect adequate; euthymic. Pleasant demeanor. [name] shares for moods to have been stable and feelings of depression have decreased siting medication compliance. Cites ability to pursue college education has also supported in increase mood. Has been accepted to attend [employer] and would like to obtain AA degree in finance. Shares has started to work on self with presenting to the gym and making healthier food choices. Reports has been working to shift thoughts on focusing on herself vs. 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 Explores obtaining additional employment or hoping for promotion at work with increased house and pay. Shares increased focus, increased sleep hygiene and being more productive. Denies feeling of being overwhelmed. Reports cognitive coping and balance thinking reframing distorted thinking. Able to identify realistic vs. Unrealistic stressors that are not in her 'box.' Progress with goals noted, improvement with sxs reported. No safety concerns. Denies SI/HI or other safety concerns.   Suicidal/Homicidal: Nowithout  intent/plan  Therapist Response: Therapist engaged [name] in therapy session. Check in and assessed for current level of functioning, sxs management and level of stressors. Explored with [name] factors that have support in increase in level of functioning and positive behavioral changes. Engaged [name] in exploring ways in which behavioral changes have effected thinking process and stability in mental heath. Explored presence of distorted thinking and ability to reframe and refocus on self vs. Things in which she can not control. Supported in Biomedical scientist and navigating adulthood. Reviewed session provided follow up. Assessed for safety.   Plan: Return again in  x 3 weeks.  Diagnosis: Bipolar 2 disorder, major depressive episode (HCC)  Generalized anxiety disorder  Attention deficit hyperactivity disorder (ADHD), combined type  Collaboration of Care: Other None  Patient/Guardian was advised Release of Information must be obtained prior to any record release in order to collaborate their care with an outside provider. Patient/Guardian was advised if they have not already done so to contact the registration department to sign all necessary forms in order for [place] to release information regarding their care.   Consent: Patient/Guardian gives verbal consent for treatment and assignment of benefits for services provided during this visit. Patient/Guardian expressed understanding and agreed to proceed.   Rockey Situ [place], [facility] 10/08/2022

## 2022-10-29 ENCOUNTER — Encounter (HOSPITAL_COMMUNITY): Payer: Self-pay | Admitting: Psychiatry

## 2022-10-29 ENCOUNTER — Ambulatory Visit (INDEPENDENT_AMBULATORY_CARE_PROVIDER_SITE_OTHER): Payer: Medicaid Other | Admitting: Psychiatry

## 2022-10-29 DIAGNOSIS — F411 Generalized anxiety disorder: Secondary | ICD-10-CM | POA: Diagnosis not present

## 2022-10-29 DIAGNOSIS — F3181 Bipolar II disorder: Secondary | ICD-10-CM

## 2022-10-29 MED ORDER — HYDROXYZINE HCL 10 MG PO TABS: 10.0000 mg | ORAL_TABLET | Freq: Three times a day (TID) | ORAL | 3 refills | Status: DC | PRN

## 2022-10-29 MED ORDER — ARIPIPRAZOLE 5 MG PO TABS: 5.0000 mg | ORAL_TABLET | Freq: Every day | ORAL | 3 refills | Status: DC

## 2022-10-29 NOTE — Progress Notes (Signed)
BH MD/PA/NP OP Progress Note    10/29/2022 11:59 AM Ashley Cantu  MRN:  [id number]  Chief Complaint: "I finally got a routine with Abilify"  HPI:21 y/o female seen today for follow up psychiatric evaluation. She has a psychiatric history of GAD, ADHD, Bipolar II disorder and OCD. Patient is currently prescribed Abilify 5 mg daily, and trazodone '50mg'$  nightly. She reports that her medications are effective in managing her psychiatric conditions.  Today she was well-groomed, pleasant, cooperative, and engaged in conversation.  She informed Probation officer that her Abilify has given her a routine. She notes that she is able to focus more. She also notes that her mood is stable, is less irritable, and no longer impulsive.  She also informed writer that her anxiety and depression are well managed. Today provider conducted a GAD 7 and patient scored a 2, at her last visit she scored 12. Provider also conducted a PHQ 9 and patient scored a 3, at her last visit she scored a 17.  Today she denies SI/HI/VAH or paranoia.  Patient reports that work is going well.  She works at a [employer].  She also notes that her that her father is doing well.   Today no medication changes made. Patient agreeable to continue medications as prescribed. No other concerns at this time.   Visit Diagnosis:    ICD-10-CM   1. Bipolar 2 disorder, major depressive episode (HCC)  F31.81 ARIPiprazole (ABILIFY) 5 MG tablet    2. 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   Asthma when she was a baby   Bone spur    Bronchiectasis ([place])    Cholelithiasis 04/01/2017   Cough 10/15/2018   Depression    Environmental  allergies    Gallbladder problem    GERD (gastroesophageal reflux disease)    Headache    Heartburn    IBS (irritable bowel syndrome)    Joint pain    Lactose intolerance    Migraine variant    Obesity    PONV (postoperative nausea and vomiting)    with the rhinoplasty surgery   Vitamin D deficiency     Past Surgical History:  Procedure Laterality Date   CHOLECYSTECTOMY N/A 04/01/2017   Procedure: LAPAROSCOPIC CHOLECYSTECTOMY;  Surgeon: Stanford Scotland, MD;  Location: [place];  Service: General;  Laterality: N/A;   RHINOPLASTY     VIDEO BRONCHOSCOPY WITH ENDOBRONCHIAL NAVIGATION N/A 07/11/2020   Procedure: VIDEO BRONCHOSCOPY WITH ENDOBRONCHIAL NAVIGATION  WITH INDOCYANINE GREEN/METHYLENE BLUE MARKING INJECTION OF RIGHT UPPER LOBE MASS;  Surgeon: Grace Isaac, MD;  Location: [place];  Service: Thoracic;  Laterality: N/A;    Family Psychiatric History: Mother Schizoaffective Bipolar type  Family History:  Family History  Problem Relation Age of Onset   Hypertension Mother    Mental illness Mother    Depression Mother    Bipolar disorder Mother    Schizophrenia Mother    Heart disease Mother    Liver disease Mother    Alcohol abuse Mother    Drug abuse Mother    Obesity Mother    Asthma Mother    Migraines Mother  High blood pressure Father    Depression Father    Sleep apnea Father    Obesity Father    Asthma Father    Migraines Father    COPD Maternal Grandmother    Asthma Maternal Grandmother    Schizophrenia Maternal Grandfather    Bipolar disorder Maternal Grandfather    Asthma Maternal Grandfather    Seizures Paternal Grandmother    Atrial fibrillation Paternal Grandmother    Asthma Paternal Grandmother    Diabetes Paternal Grandfather    Congestive Heart Failure Paternal Grandfather    Asthma Paternal Grandfather     Social History:  Social History   Socioeconomic History   Marital status: Single    Spouse name: Not on file   Number of children:  Not on file   Years of education: Not on file   Highest education level: Not on file  Occupational History   Occupation: Scientist, water quality  Tobacco Use   Smoking status: Never    Passive exposure: Yes   Smokeless tobacco: Never  Vaping Use   Vaping Use: Never used  Substance and Sexual Activity   Alcohol use: No   Drug use: No   Sexual activity: Never    Birth control/protection: Abstinence  Other Topics Concern   Not on file  Social History Narrative   Bari received her GED.   She attended [employer].   She lives with her dad only.   She has a younger brother.   She works at [employer].   Social Determinants of Health   Financial Resource Strain: Medium Risk (08/19/2022)   Overall Financial Resource Strain (CARDIA)    Difficulty of Paying Living Expenses: Somewhat hard  Food Insecurity: Food Insecurity Present (08/19/2022)   Hunger Vital Sign    Worried About Running Out of Food in the Last Year: Sometimes true    Ran Out of Food in the Last Year: Sometimes true  Transportation Needs: No Transportation Needs (08/19/2022)   PRAPARE - Hydrologist (Medical): No    Lack of Transportation (Non-Medical): No  Physical Activity: Insufficiently Active (08/19/2022)   Exercise Vital Sign    Days of Exercise per Week: 2 days    Minutes of Exercise per Session: 30 min  Stress: Stress Concern Present (08/19/2022)   [place]    Feeling of Stress : Very much  Social Connections: Socially Isolated (08/19/2022)   Social Connection and Isolation Panel [NHANES]    Frequency of Communication with Friends and Family: Never    Frequency of Social Gatherings with Friends and Family: More than three times a week    Attends Religious Services: Never    Marine scientist or Organizations: No    Attends Archivist Meetings: Never    Marital Status: Never married    Allergies:   Allergies  Allergen Reactions   Penicillins Anaphylaxis, Hives and Swelling    Has patient had a PCN reaction causing immediate rash, facial/tongue/throat swelling, SOB or lightheadedness with hypotension: yes Has patient had a PCN reaction causing severe rash involving mucus membranes or skin necrosis: no Has patient had a PCN reaction that required hospitalization: no Has patient had a PCN reaction occurring within the last 10 years: bo If all of the above answers are "NO", then may proceed with Cephalosporin use.     Metabolic Disorder Labs: Lab Results  Component Value Date   HGBA1C 5.4 03/05/2022   No  results found for: "PROLACTIN" Lab Results  Component Value Date   CHOL 142 03/05/2022   TRIG 121 (H) 03/05/2022   HDL 41 03/05/2022   CHOLHDL 3.5 03/05/2022   LDLCALC 79 03/05/2022   LDLCALC 90 04/24/2020   Lab Results  Component Value Date   TSH 2.880 03/05/2022   TSH 1.410 04/24/2020    Therapeutic Level Labs: No results found for: "LITHIUM" No results found for: "VALPROATE" No results found for: "CBMZ"  Current Medications: Current Outpatient Medications  Medication Sig Dispense Refill   albuterol (VENTOLIN HFA) 108 (90 Base) MCG/ACT inhaler Inhale 2 puffs into the lungs every 6 (six) hours as needed for wheezing or shortness of breath. 18 g 1   ARIPiprazole (ABILIFY) 5 MG tablet Take 1 tablet (5 mg total) by mouth daily. 30 tablet 3   cetirizine (ZYRTEC) 10 MG tablet Take 1 tablet (10 mg total) by mouth daily as needed. 90 tablet 1   esomeprazole (NEXIUM) 40 MG capsule TAKE 1 CAPSULE(40 MG) BY MOUTH DAILY 90 capsule 1   fluticasone (FLONASE) 50 MCG/ACT nasal spray Place 2 sprays into both nostrils daily as needed. 18.2 mL 1   hydrOXYzine (ATARAX) 10 MG tablet Take 1 tablet (10 mg total) by mouth 3 (three) times daily as needed. 90 tablet 3   montelukast (SINGULAIR) 10 MG tablet Take 1 tablet (10 mg total) by mouth at bedtime. 30 tablet 11   Vitamin D,  Ergocalciferol, (DRISDOL) 1.25 MG (50000 UNIT) CAPS capsule Take 1 capsule (50,000 Units total) by mouth every 7 (seven) days. 10 capsule 0   No current facility-administered medications for this visit.     Musculoskeletal: Strength & Muscle Tone: within normal limits Gait & Station: normal Patient leans: N/A  Psychiatric Specialty Exam: Review of Systems  There were no vitals taken for this visit.There is no height or weight on file to calculate BMI.  General Appearance: Well Groomed  Eye Contact:  Good  Speech:  Clear and Coherent and Normal Rate  Volume:  Normal  Mood:  Anxious and Depressed  Affect:  Appropriate and Congruent  Thought Process:  Coherent, Goal Directed and Linear  Orientation:  Full (Time, Place, and Person)  Thought Content: WDL and Logical   Suicidal Thoughts:  No  Homicidal Thoughts:  No  Memory:  Immediate;   Good Recent;   Good Remote;   Good  Judgement:  Good  Insight:  Good  Psychomotor Activity:  Normal  Concentration:  Concentration: Good and Attention Span: Good  Recall:  Good  Fund of Knowledge: Good  Language: Good  Akathisia:  No  Handed:  Right  AIMS (if indicated): Not done  Assets:  Communication Skills Desire for Improvement Financial Resources/Insurance Housing Social Support  ADL's:  Intact  Cognition: WNL  Sleep:  Good   Screenings: GAD-7    Flowsheet Row Clinical Support from 10/29/2022 in [facility] Counselor from 08/19/2022 in [place] from 07/29/2022 in [facility] Office Visit from 06/12/2022 in [place] at [facility] Video Visit from 05/22/2022 in [facility]  Total GAD-7 Score '2 6 12 3 3      '$ PHQ2-9    Flowsheet Row Clinical Support from 10/29/2022 in [facility] Counselor from 08/19/2022 in [place] from 07/29/2022 in [facility] Office Visit from 06/12/2022 in [facility] [place] Visit from 05/22/2022 in [facility]  PHQ-2 Total Score 0 '4 4 2 '$ 0  PHQ-9 Total Score '3 9 17 4 1      '$ Flowsheet Row Counselor from 08/19/2022 in [place] from 07/29/2022 in [facility] Video Visit from 03/12/2021 in [place] No Risk Error: Q7 should not be populated when Q6 is No No Risk        Assessment and Plan: Patient notes that she is doing well on her current medication regimen.  No medication changes made today.  Patient agreeable to continue medications as prescribed.  1. Bipolar 2 disorder, major depressive episode (HCC)  Continue- ARIPiprazole (ABILIFY) 5 MG tablet; Take 1 tablet (5 mg total) by mouth daily.  Dispense: 30 tablet; Refill: 3  2. Generalized anxiety disorder  Continue- hydrOXYzine (ATARAX) 10 MG tablet; Take 1 tablet (10 mg total) by mouth 3 (three) times daily as needed.  Dispense: 90 tablet; Refill: 3      Follow-up in 3 month Follow-up with therapy  Salley Slaughter, NP 10/29/2022, 11:59 AM

## 2022-11-04 ENCOUNTER — Ambulatory Visit (INDEPENDENT_AMBULATORY_CARE_PROVIDER_SITE_OTHER): Payer: Medicaid Other | Admitting: Mental Health

## 2022-11-04 DIAGNOSIS — F902 Attention-deficit hyperactivity disorder, combined type: Secondary | ICD-10-CM

## 2022-11-04 DIAGNOSIS — F411 Generalized anxiety disorder: Secondary | ICD-10-CM

## 2022-11-04 DIAGNOSIS — F3181 Bipolar II disorder: Secondary | ICD-10-CM

## 2022-11-04 NOTE — Progress Notes (Unsigned)
   THERAPIST PROGRESS NOTE  Session Time: 1:45pm (   Participation Level: Active  Behavioral Response: CasualAlertWNL  Type of Therapy: Individual Therapy  Treatment Goals addressed: STG: Nicol increase management of moods(depressive/manic episodes) AEB development of effective coping skills with ability to reframe maladaptive thinking within the next 6 months    STG: Gitty will increase management of stressors AEB development of effective decision making skills with ability to identify realistic vs unrealistic stressors within the next 6 months   ProgressTowards Goals: Progressing  Interventions: CBT and Supportive  Summary: Ashley Cantu is a 22 y.o. female who presents with dx of bipolar disorder, GAD and ADHD. Ashley Cantu presents alert and oriented. Smiles and laughs. Engaged and receptive to interventions. Mood and affect adequate; euthymic. Pleasant demeanor. Ashley Cantu shares for moods to have been stable and feelings of depression. Shares to have been able to move forward with enrollment to engage in college courses for the spring. Able to explore and process with therapist working to set boundaries with self and focusing on what she can control vs. Working to fix other individuals problems. Shares has been engaging in community more spending time with friends from work and shares decreased feelings of anxiety. Shares to feel as if she is working to reduce feelings of stress of others and working to make better choices for her self and her future. Shares exploration of additional employment to have more income for self and frustration of all her monies going to support the house hold. Shares difficulty obtaining full license due to inability to afford full coverage insurance for her vehicle due to paying household bills. Shares will continue to focus on working towards things in which she can control vs. Unrealistic stressors of focusing on everyone else. Denies safety concerns. Progress  with goals noted. Improvement in sxs. Medication compliant.   Suicidal/Homicidal: Nowithout intent/plan  Therapist Response: Therapist engaged Ashley Cantu in therapy session. Check in and assessed for current level of functioning, sxs management and level of stressors. Explored with Ashley Cantu factors that have support in increase in level of functioning and positive behavioral changes. Engaged Ashley Cantu in exploring ways in which focusing on others concerns and things out of her control has effected her ability to move forward on personal goals for herself. Engaged in education of working to reduce stress by focusing on what is within her realm of control and ways it limits her from moving forward with realistic stressors for her. Assessed for safety concerns. Reviewed session and provided follow up.   Plan: Return again in x 4 weeks.  Diagnosis: Bipolar 2 disorder, major depressive episode (HCC)  Generalized anxiety disorder  Attention deficit hyperactivity disorder (ADHD), combined type  Collaboration of Care: Other None  Patient/Guardian was advised Release of Information must be obtained prior to any record release in order to collaborate their care with an outside provider. Patient/Guardian was advised if they have not already done so to contact the registration department to sign all necessary forms in order for [place] to release information regarding their care.   Consent: Patient/Guardian gives verbal consent for treatment and assignment of benefits for services provided during this visit. Patient/Guardian expressed understanding and agreed to proceed.   Rockey Situ [place], [facility] 11/04/2022

## 2022-12-09 ENCOUNTER — Ambulatory Visit (INDEPENDENT_AMBULATORY_CARE_PROVIDER_SITE_OTHER): Payer: Medicaid Other | Admitting: Mental Health

## 2022-12-09 DIAGNOSIS — F411 Generalized anxiety disorder: Secondary | ICD-10-CM

## 2022-12-09 DIAGNOSIS — F3181 Bipolar II disorder: Secondary | ICD-10-CM | POA: Diagnosis not present

## 2022-12-09 DIAGNOSIS — F902 Attention-deficit hyperactivity disorder, combined type: Secondary | ICD-10-CM

## 2022-12-09 NOTE — Progress Notes (Signed)
THERAPIST PROGRESS NOTE  Session Time: 1:35pm ( 55 minutes)   Participation Level: Active  Behavioral Response: CasualAlertAdequate  Type of Therapy: Individual Therapy  Treatment Goals addressed: STG: Sissy increase management of moods(depressive/manic episodes) AEB development of effective coping skills with ability to reframe maladaptive thinking within the next 6 months    STG: Elba will increase management of stressors AEB development of effective decision making skills with ability to identify realistic vs unrealistic stressors within the next 6 months   ProgressTowards Goals: Progressing  Interventions: CBT and Supportive  Summary:  Allena Shrode is a 22 y.o. female who presents with dx of bipolar disorder, GAD and ADHD. Alaira presents alert and oriented. Smiles and laughs. Engaged and receptive to interventions. Mood and affect adequate; euthymic. Pleasant demeanor. Kathelene shares for moods to have been stable and feelings of depression and mania to be stable with mood "pretty good." Shares exploration of additional job with current position reducing hours due to slow season. Shares concerns for starting additional position with due to start college course work this summer and unclear of school schedule at this time. Shares financial stressors to have decreased with brother and his family moving into the home and shares for him to support in paying bills. Shares history of suppressing feelings with father's side of family to use humor for coping and denies to feel she is taking seriously when attempting to talk to father and shares for mother's side of the family to be overly emotional and for mother to have mental health concerns holding schizophrenia dx and hx of substance use. Shares working to acknowledge parents challenges and working to process her emotions and not adopt habit of dismissing emotions or poorly regulating. Shares feelings of always feeling as if she had to  parent her parents as well as brother. Notes high degree of working and stress in the past and working to increase balance in daily life and work to focus one herself and put in place boundaries with family. Shares working to develop effective coping skills with taking time for her self and shares would like to present to gym for coping noting to feeling better after presentation, however has ceased gym attendance recently. Shares increase in decision making skills and working to focus on stressors which are within her control and realistic for her vs. Others stressors. Progress on goals noted;sxs improvement. Denies SI/HI   Suicidal/Homicidal: Nowithout intent/plan  Therapist Response: Therapist engaged Nailea in therapy session. Check in and assessed for current level of functioning, sxs management and level of stressors. Explored with [place] factors that have support in increase in level of functioning and positive behavioral changes. Engaged in supported in navigating working to make appropriate decisions she feels lis best for her and working to move forward with desire to present to college. Supported in processing working to regulate emotions and not dismissing emotions as she has witnessed father doing in the past and coping with laughter. Provided supportive feedback; validated feelings. Engaged in processing healthy coping vs maladaptive/avoidance patterns of coping. Encouraged ongoing pursuit of balanced living and working to engage in enjoyable activities and activities on par with age group. Affirmed decision making and working to set boundaries as needed. Reviewed session, provided follow up and assessed for safety.   Plan: Return again in  x 4 weeks.  Diagnosis: Bipolar 2 disorder, major depressive episode (HCC)  Generalized anxiety disorder  Attention deficit hyperactivity disorder (ADHD), combined type  Collaboration of Care: Other None  Patient/Guardian  was advised Release of  Information must be obtained prior to any record release in order to collaborate their care with an outside provider. Patient/Guardian was advised if they have not already done so to contact the registration department to sign all necessary forms in order for [place] to release information regarding their care.   Consent: Patient/Guardian gives verbal consent for treatment and assignment of benefits for services provided during this visit. Patient/Guardian expressed understanding and agreed to proceed.   Rockey Situ [place], [facility] 12/09/2022

## 2022-12-11 ENCOUNTER — Ambulatory Visit (INDEPENDENT_AMBULATORY_CARE_PROVIDER_SITE_OTHER): Payer: Medicaid Other | Admitting: Family Medicine

## 2022-12-11 ENCOUNTER — Encounter (HOSPITAL_BASED_OUTPATIENT_CLINIC_OR_DEPARTMENT_OTHER): Payer: Self-pay | Admitting: Family Medicine

## 2022-12-11 VITALS — BP 122/97 | HR 94 | Ht 67.0 in | Wt >= 6400 oz

## 2022-12-11 DIAGNOSIS — J3089 Other allergic rhinitis: Secondary | ICD-10-CM | POA: Diagnosis not present

## 2022-12-11 DIAGNOSIS — E559 Vitamin D deficiency, unspecified: Secondary | ICD-10-CM | POA: Diagnosis not present

## 2022-12-11 MED ORDER — FLUTICASONE PROPIONATE 50 MCG/ACT NA SUSP: 2.0000 | Freq: Every day | NASAL | 1 refills | Status: DC | PRN

## 2022-12-11 MED ORDER — CETIRIZINE HCL 10 MG PO TABS: 10.0000 mg | ORAL_TABLET | Freq: Every day | ORAL | 1 refills | Status: DC | PRN

## 2022-12-11 MED ORDER — MONTELUKAST SODIUM 10 MG PO TABS: 10.0000 mg | ORAL_TABLET | Freq: Every day | ORAL | 3 refills | Status: DC

## 2022-12-11 NOTE — Assessment & Plan Note (Signed)
Patient has been doing well since last appointment.  She was taking high-dose weekly vitamin D supplement regularly until about 2 months ago.  Has not been taking any vitamin D supplement over the past 2 months now.  Last vitamin D lab was about 6 months ago, this had shown improvement in vitamin D level, however was still slightly below normal range. At this time, patient is due for recheck of vitamin D, we will proceed with this today.  Advised that we will discuss supplementation recommendation based upon laboratory results which should return tomorrow, patient voiced understanding

## 2022-12-11 NOTE — Progress Notes (Signed)
    Procedures performed today:    None.  Independent interpretation of notes and tests performed by another provider:   None.  Brief History, Exam, Impression, and Recommendations:    BP (!) 122/97 (BP Location: Left Arm, Patient Position: Sitting, Cuff Size: Large)   Pulse 94   Ht 5\' 7"  (1.702 m)   Wt (!) 485 lb (220 kg)   SpO2 99%   BMI 75.96 kg/m   Vitamin D deficiency Patient has been doing well since last appointment.  She was taking high-dose weekly vitamin D supplement regularly until about 2 months ago.  Has not been taking any vitamin D supplement over the past 2 months now.  Last vitamin D lab was about 6 months ago, this had shown improvement in vitamin D level, however was still slightly below normal range. At this time, patient is due for recheck of vitamin D, we will proceed with this today.  Advised that we will discuss supplementation recommendation based upon laboratory results which should return tomorrow, patient voiced understanding  Environmental and seasonal allergies Patient was this Singulair, Flonase, Zyrtec for control of symptoms, she is requesting refill of these medications today to continue with treatment. Can proceed with requested refills, sent to pharmacy on file  Return in about 6 months (around 06/13/2023) for Vit D def.   ___________________________________________ Cuma Polyakov de [place], MD, ABFM, CAQSM [facility] and [place]

## 2022-12-11 NOTE — Assessment & Plan Note (Signed)
Patient was this Singulair, Flonase, Zyrtec for control of symptoms, she is requesting refill of these medications today to continue with treatment. Can proceed with requested refills, sent to pharmacy on file

## 2022-12-12 ENCOUNTER — Other Ambulatory Visit (HOSPITAL_BASED_OUTPATIENT_CLINIC_OR_DEPARTMENT_OTHER): Payer: Self-pay | Admitting: Family Medicine

## 2022-12-12 DIAGNOSIS — E559 Vitamin D deficiency, unspecified: Secondary | ICD-10-CM

## 2022-12-12 LAB — VITAMIN D 25 HYDROXY (VIT D DEFICIENCY, FRACTURES): Vit D, 25-Hydroxy: 14.7 ng/mL — ABNORMAL LOW (ref 30.0–100.0)

## 2022-12-12 MED ORDER — VITAMIN D (ERGOCALCIFEROL) 1.25 MG (50000 UNIT) PO CAPS: 50000.0000 [IU] | ORAL_CAPSULE | ORAL | 0 refills | Status: DC

## 2022-12-16 NOTE — Progress Notes (Signed)
Called pt scheduled appt for LABS in 3 mths

## 2022-12-29 ENCOUNTER — Encounter (HOSPITAL_BASED_OUTPATIENT_CLINIC_OR_DEPARTMENT_OTHER): Payer: Self-pay

## 2022-12-29 ENCOUNTER — Other Ambulatory Visit (HOSPITAL_BASED_OUTPATIENT_CLINIC_OR_DEPARTMENT_OTHER): Payer: Medicaid Other

## 2023-01-05 ENCOUNTER — Encounter: Payer: Self-pay | Admitting: Adult Health

## 2023-01-05 ENCOUNTER — Ambulatory Visit (INDEPENDENT_AMBULATORY_CARE_PROVIDER_SITE_OTHER): Payer: Medicaid Other | Admitting: Adult Health

## 2023-01-05 VITALS — BP 126/88 | HR 92 | Ht 68.0 in | Wt >= 6400 oz

## 2023-01-05 DIAGNOSIS — G43009 Migraine without aura, not intractable, without status migrainosus: Secondary | ICD-10-CM

## 2023-01-05 NOTE — Progress Notes (Signed)
PATIENT: Ashley Cantu DOB: 2001-01-14  REASON FOR VISIT: follow up HISTORY FROM: patient PRIMARY NEUROLOGIST: Dr. Lucia Gaskins  Chief Complaint  Patient presents with   Follow-up    Rm 19, patient here alone for migraine follow up, patient states last migraine was approximately 2 months ago. Typically brought on by sleep deprivation. Occasional morning headaches. Patient was taking topamax but stopped taking due to interfering with other medications. Rates migraines 4/10, is not requiring any rescue medication or preventative      HISTORY OF PRESENT ILLNESS: Today 01/05/23  Kamla Skilton is a 22 y.o. female who has been followed in this office for migraine headaches. Returns today for follow-up.  Reports that her last migraine was approximately 2 months ago.  Her headaches are typically caused by sleep deprivation.  Overall her headaches are fairly mild not requiring any rescue medicine.  She stopped Topamax.  She returns today for an evaluation   HISTORY Ixel Boehning is a 22 y.o. female here as requested by Deetta Perla, MD for migraines, transfer of care.  Past medical history obesity, ADHD, anxiety, asthma, depression, morbid obesity, IBS, migraine variant, migraine without aura, bipolar 2.  I reviewed Dr. Darl Householder notes and pediatric neurology, she has migraine associated with disequilibrium and nausea and also migraine without aura with headaches that are frontal and vertex associated with nausea without vomiting and a feeling of near syncope associated with dizziness.  She was started on Topamax and Lamictal and she had not experienced any headaches or other symptoms reported 01/24/2021.  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    She doesn't really have migraines anymore, feeling well, she may have a sinus headache  every once ina  while or a headache that last 1 minutes.  Migraines started in 2019 and it was so bad she couldn't function esp. With the vestibular symptoms. But since being on the medications doing really. Here for transfer of care. No vision changes, no numbness or tingling. 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  When she was initially seen by Dr. Sharene Skeans for headaches and migraines it started about that time, she was initially placed on topiramate, she did not think it was working but on more than one occasion try to stop it and her headaches quickly worsened, she was having 1-2 headaches per week, majority appearing to be brief lasting 5 to 10 minutes, involving the frontotemporal regions with more pressure-like pain, she denied sensitivity to light but did endorse sensitivity to sound, she denied nausea and vomiting, the headaches would last all day and be moderate in intensity.  But since staying on the Topamax patient feels fine, she denies any significant headaches now, or any associated symptoms, or inciting events.No other focal neurologic deficits, associated symptoms, inciting events or modifiable factors.  REVIEW OF SYSTEMS: Out of a complete 14 system review of symptoms, the patient complains only of the following symptoms, and all other reviewed systems are negative.  ALLERGIES: Allergies  Allergen Reactions   Penicillins Anaphylaxis, Hives and Swelling    Has patient had a PCN reaction causing immediate rash, facial/tongue/throat swelling, SOB or lightheadedness with hypotension: yes Has patient had a PCN reaction causing severe rash involving mucus membranes or skin necrosis: no Has patient had a PCN reaction that required hospitalization: no Has  patient had a PCN reaction occurring within the last 10 years: bo If all of the above answers are "NO", then may proceed with Cephalosporin use.     HOME MEDICATIONS: Outpatient Medications Prior to Visit   Medication Sig Dispense Refill   albuterol (VENTOLIN HFA) 108 (90 Base) MCG/ACT inhaler Inhale 2 puffs into the lungs every 6 (six) hours as needed for wheezing or shortness of breath. 18 g 1   ARIPiprazole (ABILIFY) 5 MG tablet Take 1 tablet (5 mg total) by mouth daily. 30 tablet 3   cetirizine (ZYRTEC) 10 MG tablet Take 1 tablet (10 mg total) by mouth daily as needed. 90 tablet 1   esomeprazole (NEXIUM) 40 MG capsule TAKE 1 CAPSULE(40 MG) BY MOUTH DAILY 90 capsule 1   fluticasone (FLONASE) 50 MCG/ACT nasal spray Place 2 sprays into both nostrils daily as needed. 18.2 mL 1   hydrOXYzine (ATARAX) 10 MG tablet Take 1 tablet (10 mg total) by mouth 3 (three) times daily as needed. 90 tablet 3   montelukast (SINGULAIR) 10 MG tablet Take 1 tablet (10 mg total) by mouth at bedtime. 90 tablet 3   Vitamin D, Ergocalciferol, (DRISDOL) 1.25 MG (50000 UNIT) CAPS capsule Take 1 capsule (50,000 Units total) by mouth every 7 (seven) days. 12 capsule 0   No facility-administered medications prior to visit.    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   Asthma when she was a baby   Bone spur    Bronchiectasis    Cholelithiasis 04/01/2017   Cough 10/15/2018   Depression    Environmental allergies    Gallbladder problem    GERD (gastroesophageal reflux disease)    Headache    Heartburn    IBS (irritable bowel syndrome)    Joint pain    Lactose intolerance    Migraine variant    Obesity    PONV (postoperative nausea and vomiting)    with the rhinoplasty surgery   Vitamin D deficiency     PAST SURGICAL HISTORY: Past Surgical History:  Procedure Laterality Date   CHOLECYSTECTOMY N/A 04/01/2017   Procedure: LAPAROSCOPIC CHOLECYSTECTOMY;  Surgeon: Kandice Hams, MD;  Location: [facility] OR;  Service: General;  Laterality: N/A;    RHINOPLASTY     VIDEO BRONCHOSCOPY WITH ENDOBRONCHIAL NAVIGATION N/A 07/11/2020   Procedure: VIDEO BRONCHOSCOPY WITH ENDOBRONCHIAL NAVIGATION  WITH INDOCYANINE GREEN/METHYLENE BLUE MARKING INJECTION OF RIGHT UPPER LOBE MASS;  Surgeon: Delight Ovens, MD;  Location: [facility] OR;  Service: Thoracic;  Laterality: N/A;    FAMILY HISTORY: Family History  Problem Relation Age of Onset   Hypertension Mother    Mental illness Mother    Depression Mother    Bipolar disorder Mother    Schizophrenia Mother    Heart disease Mother    Liver disease Mother    Alcohol abuse Mother    Drug abuse Mother    Obesity Mother    Asthma Mother    Migraines Mother    High blood pressure Father    Depression Father    Sleep apnea Father    Obesity Father    Asthma Father    Migraines Father    COPD Maternal Grandmother    Asthma Maternal Grandmother    Schizophrenia Maternal Grandfather    Bipolar  disorder Maternal Grandfather    Asthma Maternal Grandfather    Seizures Paternal Grandmother    Atrial fibrillation Paternal Grandmother    Asthma Paternal Grandmother    Diabetes Paternal Grandfather    Congestive Heart Failure Paternal Grandfather    Asthma Paternal Grandfather     SOCIAL HISTORY: Social History   Socioeconomic History   Marital status: Single    Spouse name: Not on file   Number of children: Not on file   Years of education: Not on file   Highest education level: Not on file  Occupational History   Occupation: Conservation officer, nature  Tobacco Use   Smoking status: Never    Passive exposure: Yes   Smokeless tobacco: Never  Vaping Use   Vaping Use: Never used  Substance and Sexual Activity   Alcohol use: No   Drug use: No   Sexual activity: Never    Birth control/protection: Abstinence  Other Topics Concern   Not on file  Social History Narrative   Ninoska received her GED.   She attended [employer].   She lives with her dad only.   She has a younger brother.   She is ambidextrous.    1 8oz cup caffeine daily   Social Determinants of Health   Financial Resource Strain: Medium Risk (08/19/2022)   Overall Financial Resource Strain (CARDIA)    Difficulty of Paying Living Expenses: Somewhat hard  Food Insecurity: Food Insecurity Present (08/19/2022)   Hunger Vital Sign    Worried About Running Out of Food in the Last Year: Sometimes true    Ran Out of Food in the Last Year: Sometimes true  Transportation Needs: No Transportation Needs (08/19/2022)   PRAPARE - Administrator, Civil Service (Medical): No    Lack of Transportation (Non-Medical): No  Physical Activity: Insufficiently Active (08/19/2022)   Exercise Vital Sign    Days of Exercise per Week: 2 days    Minutes of Exercise per Session: 30 min  Stress: Stress Concern Present (08/19/2022)   [employer] of Occupational Health - Occupational Stress Questionnaire    Feeling of Stress : Very much  Social Connections: Socially Isolated (08/19/2022)   Social Connection and Isolation Panel [NHANES]    Frequency of Communication with Friends and Family: Never    Frequency of Social Gatherings with Friends and Family: More than three times a week    Attends Religious Services: Never    Database administrator or Organizations: No    Attends Banker Meetings: Never    Marital Status: Never married  Intimate Partner Violence: Not At Risk (08/19/2022)   Humiliation, Afraid, Rape, and Kick questionnaire    Fear of Current or Ex-Partner: No    Emotionally Abused: No    Physically Abused: No    Sexually Abused: No      PHYSICAL EXAM  Vitals:   01/05/23 1354  BP: 126/88  Pulse: 92  Weight: (!) 492 lb (223.2 kg)  Height:  (1.727 m)   Body mass index is 74.81 kg/m.  Generalized: Well developed, in no acute distress   Neurological examination  Mentation: Alert oriented to time, place, history taking. Follows all commands speech and language fluent Cranial nerve II-XII: Pupils  were equal round reactive to light. Extraocular movements were full, visual field were full on confrontational test. Facial sensation and strength were normal.  Head turning and shoulder shrug  were normal and symmetric. Motor: The motor testing reveals 5 over 5 strength  of all 4 extremities. Good symmetric motor tone is noted throughout.  Sensory: Sensory testing is intact to soft touch on all 4 extremities. No evidence of extinction is noted.  Coordination: Cerebellar testing reveals good finger-nose-finger and heel-to-shin bilaterally.  Gait and station: Gait is normal.    DIAGNOSTIC DATA (LABS, IMAGING, TESTING) - I reviewed patient records, labs, notes, testing and imaging myself where available.  Lab Results  Component Value Date   WBC 8.0 03/05/2022   HGB 12.2 03/05/2022   HCT 39.0 03/05/2022   MCV 80 03/05/2022   PLT 257 03/05/2022      Component Value Date/Time   NA 143 03/05/2022 0903   K 5.1 03/05/2022 0903   CL 108 (H) 03/05/2022 0903   CO2 21 03/05/2022 0903   GLUCOSE 96 03/05/2022 0903   GLUCOSE 146 (H) 03/12/2021 0601   BUN 11 03/05/2022 0903   CREATININE 0.60 03/05/2022 0903   CREATININE 0.68 08/13/2020 0928   CALCIUM 8.9 03/05/2022 0903   PROT 6.7 03/05/2022 0903   ALBUMIN 4.1 03/05/2022 0903   AST 12 03/05/2022 0903   ALT 14 03/05/2022 0903   ALKPHOS 95 03/05/2022 0903   BILITOT 0.3 03/05/2022 0903   GFRNONAA >60 03/12/2021 0601   GFRNONAA 128 08/13/2020 0928   GFRAA 148 08/13/2020 0928   Lab Results  Component Value Date   CHOL 142 03/05/2022   HDL 41 03/05/2022   LDLCALC 79 03/05/2022   TRIG 121 (H) 03/05/2022   CHOLHDL 3.5 03/05/2022   Lab Results  Component Value Date   HGBA1C 5.4 03/05/2022   Lab Results  Component Value Date   VITAMINB12 344 04/24/2020   Lab Results  Component Value Date   TSH 2.880 03/05/2022      ASSESSMENT AND PLAN 22 y.o. year old female  has a past medical history of Abscess, Abscess of right genital labia  (10/14/2018), ADD (attention deficit disorder), ADHD, Anxiety, Aspergilloma (09/28/2018), Asthma (when she was a baby), Bone spur, Bronchiectasis, Cholelithiasis (04/01/2017), Cough (10/15/2018), Depression, Environmental allergies, Gallbladder problem, GERD (gastroesophageal reflux disease), Headache, Heartburn, IBS (irritable bowel syndrome), Joint pain, Lactose intolerance, Migraine variant, Obesity, PONV (postoperative nausea and vomiting), and Vitamin D deficiency. here with:  Migraines   Controlled Currently not on any preventative or abortive medication Follow-up on an as-needed basis     Butch Penny, MSN, NP-C 01/05/2023, 2:12 PM [facility] [address], [address] [place], [place] [address] [phone]

## 2023-01-13 ENCOUNTER — Ambulatory Visit (INDEPENDENT_AMBULATORY_CARE_PROVIDER_SITE_OTHER): Payer: Medicaid Other | Admitting: Mental Health

## 2023-01-13 DIAGNOSIS — F3181 Bipolar II disorder: Secondary | ICD-10-CM | POA: Diagnosis not present

## 2023-01-13 DIAGNOSIS — F902 Attention-deficit hyperactivity disorder, combined type: Secondary | ICD-10-CM

## 2023-01-13 DIAGNOSIS — F411 Generalized anxiety disorder: Secondary | ICD-10-CM

## 2023-01-13 NOTE — Progress Notes (Unsigned)
   THERAPIST PROGRESS NOTE  Session Time: 2:40pm ( 48 minutes)   Participation Level: Active  Behavioral Response: CasualAlertEuthymic  Type of Therapy: Individual Therapy  Treatment Goals addressed: STG: Molli increase management of moods(depressive/manic episodes) AEB development of effective coping skills with ability to reframe maladaptive thinking within the next 6 months    STG: Emireth will increase management of stressors AEB development of effective decision making skills with ability to identify realistic vs unrealistic stressors within the next 6 months   ProgressTowards Goals: Progressing  Interventions: CBT and Supportive  Summary: Dave Mannes is a 22 y.o. female who presents with dx of bipolar disorder, GAD and ADHD. Daneille presents alert and oriented. Smiles and laughs. Engaged and receptive to interventions. Mood and affect adequate; euthymic. Pleasant demeanor. Shares to be "busy" and notes to have secured x 2 additional lines of employment. Notes working on Wednesdays and shares working [employer]. Shares to be enjoying increased work and increased access to funds. 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 Engaged with therapist to explore ways to increase organization and attending to tasks. Shares for things to be going well and home with brother and his partner living home and supporting with finances. Shares to have not been taking medications with concern for hypomania and or ADHD sxs to be increasing. Shares moods to have been "everywhere" engaged with therapist to review signs and sxs of manic episode. Receptive of education of importance of medication adherences. Shares with increase in activity this has lead to decease attendance to medications. Agrees to restart medications. Denies safety concerns. Progress with goals with decision making and stress  management.    Suicidal/Homicidal: Nowithout intent/plan  Therapist Response:  Therapist engaged Vanya in therapy session. Check in and assessed for current level of functioning, sxs management and level of stressors. Explored with [place] factors that have support in increase in level of functioning. Explored ability to navigate increase in daily tasks with additional working. Explored benefits working at increased rate and factors in which she needs to be mindful of ensuring for mental health to remain stable. Assessed for medication comp;liance with speech being increased and reports of increased energy. Provided education on importance of medication compliance and triggers for manic episodes. Encouraged working to explore ways to [employer] and ability to complete needed tasks. Encouraged medication compliance; reviewed session and provided follow up appointment.   Plan: Return again in  x 4 weeks.  Diagnosis: Bipolar 2 disorder, major depressive episode  Generalized anxiety disorder  Attention deficit hyperactivity disorder (ADHD), combined type  Collaboration of Care: Other None  Patient/Guardian was advised Release of Information must be obtained prior to any record release in order to collaborate their care with an outside provider. Patient/Guardian was advised if they have not already done so to contact the registration department to sign all necessary forms in order for [place] to release information regarding their care.   Consent: Patient/Guardian gives verbal consent for treatment and assignment of benefits for services provided during this visit. Patient/Guardian expressed understanding and agreed to proceed.   Stephan Minister [place], [facility] 01/13/2023

## 2023-01-21 ENCOUNTER — Encounter (HOSPITAL_COMMUNITY): Payer: Medicaid Other | Admitting: Psychiatry

## 2023-01-22 ENCOUNTER — Encounter (HOSPITAL_COMMUNITY): Payer: Self-pay | Admitting: Psychiatry

## 2023-01-22 ENCOUNTER — Telehealth (INDEPENDENT_AMBULATORY_CARE_PROVIDER_SITE_OTHER): Payer: Medicaid Other | Admitting: Psychiatry

## 2023-01-22 DIAGNOSIS — F3181 Bipolar II disorder: Secondary | ICD-10-CM | POA: Diagnosis not present

## 2023-01-22 DIAGNOSIS — F411 Generalized anxiety disorder: Secondary | ICD-10-CM | POA: Diagnosis not present

## 2023-01-22 MED ORDER — ARIPIPRAZOLE 5 MG PO TABS: 5.0000 mg | ORAL_TABLET | Freq: Every day | ORAL | 3 refills | Status: DC

## 2023-01-22 MED ORDER — HYDROXYZINE HCL 10 MG PO TABS: 10.0000 mg | ORAL_TABLET | Freq: Three times a day (TID) | ORAL | 3 refills | Status: DC | PRN

## 2023-01-22 NOTE — Progress Notes (Signed)
BH MD/PA/NP OP Progress Note Virtual Visit via Video Note  I connected with Ashley Cantu on 01/22/23 at  2:00 PM EDT by a video enabled telemedicine application and verified that I am speaking with the correct person using two identifiers.  Location: Patient: Home Provider: Clinic   I discussed the limitations of evaluation and management by telemedicine and the availability of in person appointments. The patient expressed understanding and agreed to proceed.  I provided 30 minutes of non-face-to-face time during this encounter.     01/22/2023 2:18 PM Ashley Cantu  MRN:  [id number]  Chief Complaint: "I got a new job" [employer]   HPI:21 y/o female seen today for follow up psychiatric evaluation. She has a psychiatric history of GAD, ADHD, Bipolar II disorder and OCD. Patient is currently prescribed Abilify 5 mg daily, and trazodone 50mg  nightly. She reports that her medications are effective in managing her psychiatric conditions.  Today she was well-groomed, pleasant, cooperative, and engaged in conversation.  She informed Clinical research associate that she recently started a new job at [employer]. She reports that it is a busy place as well as fun. 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 Recently patient notes that she has been restless. She notes that she has to stay busy.  Patient notes that her restless may be due to her ADHD.  Provider unable to do an aims assessment today.  She does not know that she has entered restlessness/symptoms of akathisia.  Patient notes that he has abnormal for muscle movements but notes that she has had it for years prior to being on Abilify or any antipsychotic.  Provider request that patient come into clinic at her next appointment for further evaluation.  She endorsed understanding and agreed.    Since her last visit she informed writer that her anxiety and depression are well-managed.  Today provider conducted GAD-7 and patient scored a 4, at her  last visit she scored 2.  Provider also conducted PHQ-9 and patient scored a 4, at her last visit she scored a 3.  She endorses adequate sleep and appetite.  Today she denies SI/HI/VAH or paranoia.  Today no medication changes made. Patient agreeable to continue medications as prescribed. No other concerns at this time.   Visit Diagnosis:    ICD-10-CM   1. Bipolar 2 disorder, major depressive episode (HCC)  F31.81 ARIPiprazole (ABILIFY) 5 MG tablet    2. 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   Asthma when she was a baby   Bone spur    Bronchiectasis (HCC)    Cholelithiasis 04/01/2017   Cough 10/15/2018   Depression    Environmental allergies    Gallbladder problem    GERD (gastroesophageal reflux disease)    Headache    Heartburn    IBS (irritable bowel syndrome)    Joint pain    Lactose intolerance    Migraine variant    Obesity    PONV (postoperative nausea and vomiting)    with the rhinoplasty surgery   Vitamin D deficiency     Past Surgical History:  Procedure Laterality Date  CHOLECYSTECTOMY N/A 04/01/2017   Procedure: LAPAROSCOPIC CHOLECYSTECTOMY;  Surgeon: Kandice Hams, MD;  Location: [facility] OR;  Service: General;  Laterality: N/A;   RHINOPLASTY     VIDEO BRONCHOSCOPY WITH ENDOBRONCHIAL NAVIGATION N/A 07/11/2020   Procedure: VIDEO BRONCHOSCOPY WITH ENDOBRONCHIAL NAVIGATION  WITH INDOCYANINE GREEN/METHYLENE BLUE MARKING INJECTION OF RIGHT UPPER LOBE MASS;  Surgeon: Delight Ovens, MD;  Location: [facility] OR;  Service: Thoracic;  Laterality: N/A;    Family Psychiatric History: Mother Schizoaffective Bipolar type  Family History:  Family History   Problem Relation Age of Onset   Hypertension Mother    Mental illness Mother    Depression Mother    Bipolar disorder Mother    Schizophrenia Mother    Heart disease Mother    Liver disease Mother    Alcohol abuse Mother    Drug abuse Mother    Obesity Mother    Asthma Mother    Migraines Mother    High blood pressure Father    Depression Father    Sleep apnea Father    Obesity Father    Asthma Father    Migraines Father    COPD Maternal Grandmother    Asthma Maternal Grandmother    Schizophrenia Maternal Grandfather    Bipolar disorder Maternal Grandfather    Asthma Maternal Grandfather    Seizures Paternal Grandmother    Atrial fibrillation Paternal Grandmother    Asthma Paternal Grandmother    Diabetes Paternal Grandfather    Congestive Heart Failure Paternal Grandfather    Asthma Paternal Grandfather     Social History:  Social History   Socioeconomic History   Marital status: Single    Spouse name: Not on file   Number of children: Not on file   Years of education: Not on file   Highest education level: Not on file  Occupational History   Occupation: Conservation officer, nature  Tobacco Use   Smoking status: Never    Passive exposure: Yes   Smokeless tobacco: Never  Vaping Use   Vaping Use: Never used  Substance and Sexual Activity   Alcohol use: No   Drug use: No   Sexual activity: Never    Birth control/protection: Abstinence  Other Topics Concern   Not on file  Social History Narrative   Anwitha received her GED.   She attended [employer].   She lives with her dad only.   She has a younger brother.   She is ambidextrous.   1 8oz cup caffeine daily   Social Determinants of Health   Financial Resource Strain: Medium Risk (08/19/2022)   Overall Financial Resource Strain (CARDIA)    Difficulty of Paying Living Expenses: Somewhat hard  Food Insecurity: Food Insecurity Present (08/19/2022)   Hunger Vital Sign    Worried About Running Out of Food in the Last Year:  Sometimes true    Ran Out of Food in the Last Year: Sometimes true  Transportation Needs: No Transportation Needs (08/19/2022)   PRAPARE - Administrator, Civil Service (Medical): No    Lack of Transportation (Non-Medical): No  Physical Activity: Insufficiently Active (08/19/2022)   Exercise Vital Sign    Days of Exercise per Week: 2 days    Minutes of Exercise per Session: 30 min  Stress: Stress Concern Present (08/19/2022)   [employer] of Occupational Health - Occupational Stress Questionnaire    Feeling of Stress : Very much  Social Connections: Socially Isolated (08/19/2022)   Social Connection and Isolation Panel [NHANES]  Frequency of Communication with Friends and Family: Never    Frequency of Social Gatherings with Friends and Family: More than three times a week    Attends Religious Services: Never    Database administrator or Organizations: No    Attends Banker Meetings: Never    Marital Status: Never married    Allergies:  Allergies  Allergen Reactions   Penicillins Anaphylaxis, Hives and Swelling    Has patient had a PCN reaction causing immediate rash, facial/tongue/throat swelling, SOB or lightheadedness with hypotension: yes Has patient had a PCN reaction causing severe rash involving mucus membranes or skin necrosis: no Has patient had a PCN reaction that required hospitalization: no Has patient had a PCN reaction occurring within the last 10 years: bo If all of the above answers are "NO", then may proceed with Cephalosporin use.     Metabolic Disorder Labs: Lab Results  Component Value Date   HGBA1C 5.4 03/05/2022   No results found for: "PROLACTIN" Lab Results  Component Value Date   CHOL 142 03/05/2022   TRIG 121 (H) 03/05/2022   HDL 41 03/05/2022   CHOLHDL 3.5 03/05/2022   LDLCALC 79 03/05/2022   LDLCALC 90 04/24/2020   Lab Results  Component Value Date   TSH 2.880 03/05/2022   TSH 1.410 04/24/2020     Therapeutic Level Labs: No results found for: "LITHIUM" No results found for: "VALPROATE" No results found for: "CBMZ"  Current Medications: Current Outpatient Medications  Medication Sig Dispense Refill   albuterol (VENTOLIN HFA) 108 (90 Base) MCG/ACT inhaler Inhale 2 puffs into the lungs every 6 (six) hours as needed for wheezing or shortness of breath. 18 g 1   ARIPiprazole (ABILIFY) 5 MG tablet Take 1 tablet (5 mg total) by mouth daily. 30 tablet 3   cetirizine (ZYRTEC) 10 MG tablet Take 1 tablet (10 mg total) by mouth daily as needed. 90 tablet 1   esomeprazole (NEXIUM) 40 MG capsule TAKE 1 CAPSULE(40 MG) BY MOUTH DAILY 90 capsule 1   fluticasone (FLONASE) 50 MCG/ACT nasal spray Place 2 sprays into both nostrils daily as needed. 18.2 mL 1   hydrOXYzine (ATARAX) 10 MG tablet Take 1 tablet (10 mg total) by mouth 3 (three) times daily as needed. 90 tablet 3   montelukast (SINGULAIR) 10 MG tablet Take 1 tablet (10 mg total) by mouth at bedtime. 90 tablet 3   Vitamin D, Ergocalciferol, (DRISDOL) 1.25 MG (50000 UNIT) CAPS capsule Take 1 capsule (50,000 Units total) by mouth every 7 (seven) days. 12 capsule 0   No current facility-administered medications for this visit.     Musculoskeletal: Strength & Muscle Tone: within normal limits Gait & Station: normal Patient leans: N/A  Psychiatric Specialty Exam: Review of Systems  Last menstrual period 12/13/2022.There is no height or weight on file to calculate BMI.  General Appearance: Well Groomed  Eye Contact:  Good  Speech:  Clear and Coherent and Normal Rate  Volume:  Normal  Mood:  Anxious and Depressed  Affect:  Appropriate and Congruent  Thought Process:  Coherent, Goal Directed and Linear  Orientation:  Full (Time, Place, and Person)  Thought Content: WDL and Logical   Suicidal Thoughts:  No  Homicidal Thoughts:  No  Memory:  Immediate;   Good Recent;   Good Remote;   Good  Judgement:  Good  Insight:  Good   Psychomotor Activity:  Restlessness  Concentration:  Concentration: Good and Attention Span: Good  Recall:  Good  Fund of Knowledge: Good  Language: Good  Akathisia:  No  Handed:  Right  AIMS (if indicated): Not done  Assets:  Communication Skills Desire for Improvement Financial Resources/Insurance Housing Social Support  ADL's:  Intact  Cognition: WNL  Sleep:  Good   Screenings: GAD-7    Flowsheet Row Video Visit from 01/22/2023 in [facility] Office Visit from 12/11/2022 in [facility] at [facility] Clinical Support from 10/29/2022 in [facility] Counselor from 08/19/2022 in [facility] Clinical Support from 07/29/2022 in [facility]  Total GAD-7 Score 4 0 2 6 12       PHQ2-9    Flowsheet Row Video Visit from 01/22/2023 in [facility] Office Visit from 12/11/2022 in [facility] at [facility] Clinical Support from 10/29/2022 in [facility] Counselor from 08/19/2022 in [facility] Clinical Support from 07/29/2022 in [facility]  PHQ-2 Total Score 1 0 0 4 4  PHQ-9 Total Score 4 -- 3 9 17       Flowsheet Row Counselor from 08/19/2022 in [facility] Clinical Support from 07/29/2022 in [facility] Video Visit from 03/12/2021 in [facility]  C-SSRS RISK CATEGORY No Risk Error: Q7 should not be populated when Q6 is No No Risk        Assessment and Plan: Patient reports that at times she is restless.  She informed Clinical research associate that it could be because of her ADHD.  Patient did not note that she had entered restlessness/symptoms of akathisia.  Provider encouraged patient to come in person for her next visit.  She  endorsed understanding and agreed.  Overall she notes her mood, anxiety, and depression are well-managed.  No medication changes made today.  Patient agreeable to continue medications as prescribed.  1. Bipolar 2 disorder, major depressive episode (HCC)  Continue- ARIPiprazole (ABILIFY) 5 MG tablet; Take 1 tablet (5 mg total) by mouth daily.  Dispense: 30 tablet; Refill: 3  2. Generalized anxiety disorder  Continue- hydrOXYzine (ATARAX) 10 MG tablet; Take 1 tablet (10 mg total) by mouth 3 (three) times daily as needed.  Dispense: 90 tablet; Refill: 3      Follow-up in 3 month Follow-up with therapy  Shanna Cisco, NP 01/22/2023, 2:18 PM

## 2023-02-19 ENCOUNTER — Ambulatory Visit (INDEPENDENT_AMBULATORY_CARE_PROVIDER_SITE_OTHER): Payer: Medicaid Other | Admitting: Mental Health

## 2023-02-19 ENCOUNTER — Encounter (HOSPITAL_COMMUNITY): Payer: Self-pay

## 2023-02-19 DIAGNOSIS — F3181 Bipolar II disorder: Secondary | ICD-10-CM | POA: Diagnosis not present

## 2023-02-19 DIAGNOSIS — F902 Attention-deficit hyperactivity disorder, combined type: Secondary | ICD-10-CM

## 2023-02-19 DIAGNOSIS — F411 Generalized anxiety disorder: Secondary | ICD-10-CM

## 2023-02-19 NOTE — Progress Notes (Signed)
   THERAPIST PROGRESS NOTE  Session Time: 1:06 pm ( 52 minutes)  Participation Level: Active  Behavioral Response: CasualAlertEuthymic  Type of Therapy: Individual Therapy  Treatment Goals addressed: STG: Ashley Cantu increase management of moods(depressive/manic episodes) AEB development of effective coping skills with ability to reframe maladaptive thinking within the next 6 months    STG: Ashley Cantu will increase management of stressors AEB development of effective decision making skills with ability to identify realistic vs unrealistic stressors within the next 6 months   ProgressTowards Goals: Progressing  Interventions: CBT and Supportive  Summary:  Ashley Cantu is a 22 y.o. female who presents with dx of bipolar disorder, GAD and ADHD. Ashley Cantu presents alert and oriented. Smiles and laughs. Engaged and receptive to interventions. Mood and affect adequate; euthymic. Pleasant demeanor. Shares to be "busy" and notes continue to work x 2 jobs. Notes working full time at [employer] and has started school as of today. Shares for moods to have been stable but has periods in which she binge eats as a result of stress. Notes " I am either stress, bored or sad." Shares history of having to take care of things as a young child and emotions being suppressed or made into jokes when younger combined with a mother who experienced extreme highs or lows. Notes difficulty with sadness and emotions. Able to process ways in which she uses laughter to suppress emotions. Shares concern for ADHD sxs in which has made it difficult for her to complete needed tasks around the house. Engages with therapist to explore ways to support success with school. Reviewed treatment plan. Reports to be medication compliant. Progress with goals with sxs largely stable ongoing work for ability to manage emotions; increase in decision making skills nd management of stressors. Denies SI/HI Suicidal/Homicidal: Nowithout  intent/plan  Therapist Response: Therapist engaged Ashley Cantu in therapy session. Check in and assessed for current level of functioning, sxs management and level of stressors. Explored with Ashley Cantu factors that have support in increase in level of functioning. Explored ability to navigate increase in daily tasks and way sto cope with ADHD sxs. Engaged in CBT skills and interventions with exploration of emotions and ability to validate emotions vs. Suppressing. Explored role of binge eating with emotions. Encouraged increasing mindfulness with emotions and working to develop ability to identify and sit with emotions without suppressing. Reviewed session provided follow up. Reviewed txt plan. No safety concerns reported.   Plan: Return again in  x 4 weeks.  Diagnosis: Bipolar 2 disorder, major depressive episode (HCC)  Generalized anxiety disorder  Attention deficit hyperactivity disorder (ADHD), combined type  Collaboration of Care: Other None  Patient/Guardian was advised Release of Information must be obtained prior to any record release in order to collaborate their care with an outside provider. Patient/Guardian was advised if they have not already done so to contact the registration department to sign all necessary forms in order for [place] to release information regarding their care.   Consent: Patient/Guardian gives verbal consent for treatment and assignment of benefits for services provided during this visit. Patient/Guardian expressed understanding and agreed to proceed.   Ashley Cantu, [facility] 02/19/2023

## 2023-03-07 ENCOUNTER — Other Ambulatory Visit (HOSPITAL_BASED_OUTPATIENT_CLINIC_OR_DEPARTMENT_OTHER): Payer: Self-pay | Admitting: Family Medicine

## 2023-03-07 DIAGNOSIS — E559 Vitamin D deficiency, unspecified: Secondary | ICD-10-CM

## 2023-03-09 NOTE — Telephone Encounter (Signed)
Please advise on refill request

## 2023-03-17 ENCOUNTER — Ambulatory Visit (INDEPENDENT_AMBULATORY_CARE_PROVIDER_SITE_OTHER): Payer: Medicaid Other | Admitting: Family Medicine

## 2023-03-17 ENCOUNTER — Encounter (HOSPITAL_BASED_OUTPATIENT_CLINIC_OR_DEPARTMENT_OTHER): Payer: Self-pay | Admitting: Family Medicine

## 2023-03-17 ENCOUNTER — Other Ambulatory Visit (HOSPITAL_BASED_OUTPATIENT_CLINIC_OR_DEPARTMENT_OTHER): Payer: Medicaid Other

## 2023-03-17 VITALS — BP 133/79 | HR 90 | Ht 68.0 in | Wt >= 6400 oz

## 2023-03-17 DIAGNOSIS — F3181 Bipolar II disorder: Secondary | ICD-10-CM | POA: Diagnosis not present

## 2023-03-17 DIAGNOSIS — E559 Vitamin D deficiency, unspecified: Secondary | ICD-10-CM | POA: Diagnosis not present

## 2023-03-17 DIAGNOSIS — Z0000 Encounter for general adult medical examination without abnormal findings: Secondary | ICD-10-CM | POA: Diagnosis not present

## 2023-03-17 DIAGNOSIS — N63 Unspecified lump in unspecified breast: Secondary | ICD-10-CM | POA: Diagnosis not present

## 2023-03-17 NOTE — Progress Notes (Signed)
Subjective:   Ashley Cantu 2000-10-25  03/17/2023   CC: Chief Complaint  Patient presents with   Annual Exam    Pt is here today for her physical. Denies any concerns for today's visit.    HPI: Ashley Cantu is a 22 y.o. female who presents for a routine health maintenance exam.  Labs to be collected when patient is fasting within the week.   HEALTH SCREENINGS: - Vision Screening:  Noticed blurred vision, will get exam - Dental Visits:  Will schedule - Pap smear:  Scheduled for Pap Smear in September - Breast Exam:  Done today - STD Screening: Declined - Mammogram (40+): Not applicable  - Colonoscopy (45+): Not applicable  - Bone Density (65+ or under 65 with predisposing conditions): Not applicable  - Lung CA screening with low-dose CT:  Not applicable Adults age 67-80 who are current cigarette smokers or quit within the last 15 years. Must have 20 pack year history.   Depression and Anxiety Screen done today and results listed below:     03/17/2023    2:17 PM 01/22/2023    2:12 PM 12/11/2022    3:17 PM 10/29/2022   11:45 AM 08/19/2022   12:39 PM  Depression screen PHQ 2/9  Decreased Interest 1  0    Down, Depressed, Hopeless 1  0    PHQ - 2 Score 2  0    Altered sleeping 1      Tired, decreased energy 2      Change in appetite 3      Feeling bad or failure about yourself  1      Trouble concentrating 2      Moving slowly or fidgety/restless 1      Suicidal thoughts 0      PHQ-9 Score 12      Difficult doing work/chores Somewhat difficult         Information is confidential and restricted. Go to Review Flowsheets to unlock data.      03/17/2023    2:18 PM 01/22/2023    2:13 PM 12/11/2022    3:17 PM 10/29/2022   11:47 AM  GAD 7 : Generalized Anxiety Score  Nervous, Anxious, on Edge 1  0   Control/stop worrying 1  0   Worry too much - different things 0  0   Trouble relaxing 0  0   Restless 1  0   Easily annoyed or irritable 1  0   Afraid - awful might  happen 1  0   Total GAD 7 Score 5  0   Anxiety Difficulty Somewhat difficult  Not difficult at all      Information is confidential and restricted. Go to Review Flowsheets to unlock data.    IMMUNIZATIONS: - Tdap: Tetanus vaccination status reviewed: last tetanus booster within 10 years. - HPV:  Completed per patient by pediatrics - Influenza: Postponed to flu season - Pneumovax: Not applicable - Prevnar 20: Not applicable - Zostavax (50+): Not applicable   Past medical history, surgical history, medications, allergies, family history and social history reviewed with patient today and changes made to appropriate areas of the chart.   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   Asthma when she was a baby   Bone spur  Bronchiectasis (HCC)    Cholelithiasis 04/01/2017   Cough 10/15/2018   Depression    Environmental allergies    Gallbladder problem    GERD (gastroesophageal reflux disease)    Headache    Heartburn    IBS (irritable bowel syndrome)    Joint pain    Lactose intolerance    Migraine variant    Obesity    PONV (postoperative nausea and vomiting)    with the rhinoplasty surgery   Vitamin D deficiency     Past Surgical History:  Procedure Laterality Date   CHOLECYSTECTOMY N/A 04/01/2017   Procedure: LAPAROSCOPIC CHOLECYSTECTOMY;  Surgeon: Kandice Hams, MD;  Location: [facility] OR;  Service: General;  Laterality: N/A;   RHINOPLASTY     VIDEO BRONCHOSCOPY WITH ENDOBRONCHIAL NAVIGATION N/A 07/11/2020   Procedure: VIDEO BRONCHOSCOPY WITH ENDOBRONCHIAL NAVIGATION  WITH INDOCYANINE GREEN/METHYLENE BLUE MARKING INJECTION OF RIGHT UPPER LOBE MASS;  Surgeon: Delight Ovens, MD;  Location: [facility] OR;  Service: Thoracic;  Laterality: N/A;    Current Outpatient Medications on File Prior to Visit  Medication Sig    albuterol (VENTOLIN HFA) 108 (90 Base) MCG/ACT inhaler Inhale 2 puffs into the lungs every 6 (six) hours as needed for wheezing or shortness of breath.   ARIPiprazole (ABILIFY) 5 MG tablet Take 1 tablet (5 mg total) by mouth daily.   cetirizine (ZYRTEC) 10 MG tablet Take 1 tablet (10 mg total) by mouth daily as needed.   esomeprazole (NEXIUM) 40 MG capsule TAKE 1 CAPSULE(40 MG) BY MOUTH DAILY   fluticasone (FLONASE) 50 MCG/ACT nasal spray Place 2 sprays into both nostrils daily as needed.   hydrOXYzine (ATARAX) 10 MG tablet Take 1 tablet (10 mg total) by mouth 3 (three) times daily as needed.   montelukast (SINGULAIR) 10 MG tablet Take 1 tablet (10 mg total) by mouth at bedtime.   Vitamin D, Ergocalciferol, (DRISDOL) 1.25 MG (50000 UNIT) CAPS capsule Take 1 capsule (50,000 Units total) by mouth every 7 (seven) days. (Patient not taking: Reported on 03/17/2023)   No current facility-administered medications on file prior to visit.    Allergies  Allergen Reactions   Penicillins Anaphylaxis, Hives and Swelling    Has patient had a PCN reaction causing immediate rash, facial/tongue/throat swelling, SOB or lightheadedness with hypotension: yes Has patient had a PCN reaction causing severe rash involving mucus membranes or skin necrosis: no Has patient had a PCN reaction that required hospitalization: no Has patient had a PCN reaction occurring within the last 10 years: bo If all of the above answers are "NO", then may proceed with Cephalosporin use.      Social History   Socioeconomic History   Marital status: Single    Spouse name: Not on file   Number of children: Not on file   Years of education: Not on file   Highest education level: Not on file  Occupational History   Occupation: Conservation officer, nature  Tobacco Use   Smoking status: Never    Passive exposure: Yes   Smokeless tobacco: Never  Vaping Use   Vaping Use: Never used  Substance and Sexual Activity   Alcohol use: No   Drug use: No    Sexual activity: Never    Birth control/protection: Abstinence  Other Topics Concern   Not on file  Social History Narrative   Shaleena received her GED.   She attended [employer].   She lives with her dad only.   She has a younger brother.   She is ambidextrous.  1 8oz cup caffeine daily   Social Determinants of Health   Financial Resource Strain: High Risk (03/17/2023)   Overall Financial Resource Strain (CARDIA)    Difficulty of Paying Living Expenses: Hard  Food Insecurity: No Food Insecurity (03/17/2023)   Hunger Vital Sign    Worried About Running Out of Food in the Last Year: Never true    Ran Out of Food in the Last Year: Never true  Transportation Needs: No Transportation Needs (03/17/2023)   PRAPARE - Administrator, Civil Service (Medical): No    Lack of Transportation (Non-Medical): No  Physical Activity: Sufficiently Active (03/17/2023)   Exercise Vital Sign    Days of Exercise per Week: 4 days    Minutes of Exercise per Session: 40 min  Stress: No Stress Concern Present (03/17/2023)   [employer] of Occupational Health - Occupational Stress Questionnaire    Feeling of Stress : Only a little  Social Connections: Socially Isolated (03/17/2023)   Social Connection and Isolation Panel [NHANES]    Frequency of Communication with Friends and Family: More than three times a week    Frequency of Social Gatherings with Friends and Family: Twice a week    Attends Religious Services: Never    Database administrator or Organizations: No    Attends Banker Meetings: Never    Marital Status: Never married  Catering manager Violence: Not At Risk (03/17/2023)   Humiliation, Afraid, Rape, and Kick questionnaire    Fear of Current or Ex-Partner: No    Emotionally Abused: No    Physically Abused: No    Sexually Abused: No   Social History   Tobacco Use  Smoking Status Never   Passive exposure: Yes  Smokeless Tobacco Never   Social History    Substance and Sexual Activity  Alcohol Use No    Family History  Problem Relation Age of Onset   Hypertension Mother    Mental illness Mother    Depression Mother    Bipolar disorder Mother    Schizophrenia Mother    Heart disease Mother    Liver disease Mother    Alcohol abuse Mother    Drug abuse Mother    Obesity Mother    Asthma Mother    Migraines Mother    High blood pressure Father    Depression Father    Sleep apnea Father    Obesity Father    Asthma Father    Migraines Father    COPD Maternal Grandmother    Asthma Maternal Grandmother    Schizophrenia Maternal Grandfather    Bipolar disorder Maternal Grandfather    Asthma Maternal Grandfather    Seizures Paternal Grandmother    Atrial fibrillation Paternal Grandmother    Asthma Paternal Grandmother    Diabetes Paternal Grandfather    Congestive Heart Failure Paternal Grandfather    Asthma Paternal Grandfather      ROS: Denies fever, fatigue, unexplained weight loss/gain, chest pain, SHOB, and palpitations. Denies neurological deficits, gastrointestinal or genitourinary complaints, and skin changes.   Objective:   Today's Vitals   03/17/23 1415  BP: 133/79  Pulse: 90  SpO2: 99%  Weight: (!) 497 lb (225.4 kg)  Height: 5\' 8"  (1.727 m)    GENERAL APPEARANCE: Obese, in NAD. Well nourished.  SKIN: Pink, warm and dry. Turgor normal. No rash, lesion, ulceration, or ecchymoses. Hair evenly distributed.  HEENT: HEAD: Normocephalic.  EYES: PERRLA. EOMI. Lids intact w/o defect. Sclera white, Conjunctiva pink  w/o exudate.  EARS: External ear w/o redness, swelling, masses or lesions. EAC clear. TM's intact, translucent w/o bulging, appropriate landmarks visualized. Appropriate acuity to conversational tones.  NOSE: Septum midline w/o deformity. Nares patent, mucosa pink and non-inflamed w/o drainage. No sinus tenderness.  THROAT: Uvula midline. Oropharynx clear. Tonsils non-inflamed w/o exudate. Oral mucosa  pink and moist.  NECK: Supple, Trachea midline. Full ROM w/o pain or tenderness. No lymphadenopathy. Thyroid non-tender w/o enlargement or palpable masses.  BREASTS: Breasts pendulous, symmetrical, and w/ palpable dense nodules bilaterally near nipple bilaterally. Nipples everted and w/o discharge. No rash or skin retraction. No axillary or supraclavicular lymphadenopathy.  RESPIRATORY: Chest wall symmetrical w/o masses. Respirations even and non-labored. Breath sounds clear to auscultation bilaterally. No wheezes, rales, rhonchi, or crackles. CARDIAC: S1, S2 present, regular rate and rhythm. No gallops, murmurs, rubs, or clicks. PMI w/o lifts, heaves, or thrills. No carotid bruits. Capillary refill <2 seconds. Peripheral pulses 2+ bilaterally. GI: Abdomen soft w/o distention. Normoactive bowel sounds. No palpable masses or tenderness. No guarding or rebound tenderness. Liver and spleen w/o tenderness or enlargement. No CVA tenderness.  GU: Pt declined.  MSK: Muscle tone and strength appropriate for age, w/o atrophy or abnormal movement.  EXTREMITIES: Active ROM intact, w/o tenderness, crepitus, or contracture. No obvious joint deformities or effusions. No clubbing, edema, or cyanosis.  NEUROLOGIC: CN's II-XII intact. Motor strength symmetrical with no obvious weakness. No sensory deficits.  Steady, even gait.  PSYCH/MENTAL STATUS: Alert, oriented x 3. Cooperative, appropriate mood and affect.   Chaperoned by Cristy Hilts, CMA    Assessment & Plan:    Orders Placed This Encounter  Procedures   [place] LIMITED ULTRASOUND INCLUDING AXILLA LEFT BREAST     Standing Status:   Future    Standing Expiration Date:   03/16/2024    Order Specific Question:   Reason for Exam (SYMPTOM  OR DIAGNOSIS REQUIRED)    Answer:   Palpable nodules present to bilateral breasts with tenderness    Order Specific Question:   Preferred imaging location?    Answer[facility]   [place] LIMITED ULTRASOUND INCLUDING  AXILLA RIGHT BREAST    Standing Status:   Future    Standing Expiration Date:   03/16/2024    Order Specific Question:   Reason for Exam (SYMPTOM  OR DIAGNOSIS REQUIRED)    Answer:   Palpable bilateral breast nodules w/ tenderness    Order Specific Question:   Preferred imaging location?    Answer:   [facility]   Lipid Profile    Standing Status:   Future    Standing Expiration Date:   03/16/2024   CBC with Differential    Standing Status:   Future    Standing Expiration Date:   03/16/2024   Comprehensive metabolic panel    Standing Status:   Future    Standing Expiration Date:   03/16/2024   HgB A1c    Standing Status:   Future    Standing Expiration Date:   03/16/2024    1. Annual physical exam Patient counseling provided regarding healthy lifestyle as listed below. Will obtain fasting labs AE in the next week.   - Lipid Profile; Future - CBC with Differential; Future - Comprehensive metabolic panel; Future - HgB A1c; Future  2. Vitamin D deficiency Will refill Vitamin d as needed pending lab result. Will recheck level in 6 months.   3. Breast nodule Likely hormone related. Encouraged watchful waiting with patient's menstrual cycle  Versus imaging. Patient would like to proceed with imaging. Will obtain US bilateral breasts to rule out possible malignancy given patient's chronicity and family hx of breast cancers. Pt will be called to schedule.   - [place] LIMITED ULTRASOUND INCLUDING AXILLA LEFT BREAST ; Future - [place] LIMITED ULTRASOUND INCLUDING AXILLA RIGHT BREAST; Future  4. Bipolar 2 disorder, major depressive disorder Continue management by behavioral health. Stable at this time per patient.   PATIENT COUNSELING:  - Encouraged a healthy well-balanced diet. Patient may adjust caloric intake to maintain or achieve ideal body weight. May reduce intake of dietary saturated fat and total fat and have adequate dietary potassium and calcium preferably from fresh fruits,  vegetables, and low-fat dairy products.   - Advised to avoid cigarette smoking. - Discussed with the patient that most people either abstain from alcohol or drink within safe limits (<=14/week and <=4 drinks/occasion for males, <=7/weeks and <= 3 drinks/occasion for females) and that the risk for alcohol disorders and other health effects rises proportionally with the number of drinks per week and how often a drinker exceeds daily limits. - Discussed cessation/primary prevention of drug use and availability of treatment for abuse.  - Discussed sexually transmitted diseases, avoidance of unintended pregnancy and contraceptive alternatives.  - Stressed the importance of regular exercise - Injury prevention: Discussed safety belts, safety helmets, smoke detector, smoking near bedding or upholstery.  - Dental health: Discussed importance of regular tooth brushing, flossing, and dental visits.   NEXT PREVENTATIVE PHYSICAL DUE IN 1 YEAR.  Patient to reach out to office if new, worrisome, or unresolved symptoms arise or if no improvement in patient's condition. Patient verbalized understanding and is agreeable to treatment plan. All questions answered to patient's satisfaction.    Return in about 6 months (around 09/16/2023) for Check up vitamin d (lab prior) .  Yolanda Manges, FNP

## 2023-03-17 NOTE — Assessment & Plan Note (Signed)
Continue management by [facility] Provider

## 2023-03-17 NOTE — Patient Instructions (Addendum)
Please get your labs in 1-2 weeks when fasting.   Things to do to keep yourself healthy: - Exercise at least 30-45 minutes a day, 3-4 days a week.  - Eat a low-fat diet with lots of fruits and vegetables, up to 7-9 servings per day.  - Seatbelts can save your life. Wear them always.  - Smoke detectors on every level of your home, check batteries every year.  - Eye Doctor - have an eye exam every 1-2 years  - Safe sex - if you may be exposed to STDs, use a condom.  - No smoking, vaping, or use of any tobacco products.  - Alcohol -  If you drink, do it moderately, less than 2 drinks per day.  - No illegal drug use.  - Depression is common in our stressful world.If you're feeling down or losing interest in things you normally enjoy, please come in for a visit.  - Violence - If anyone is threatening or hurting you, please call immediately.

## 2023-03-18 LAB — VITAMIN D 25 HYDROXY (VIT D DEFICIENCY, FRACTURES): Vit D, 25-Hydroxy: 17.7 ng/mL — ABNORMAL LOW (ref 30.0–100.0)

## 2023-03-19 ENCOUNTER — Other Ambulatory Visit (HOSPITAL_BASED_OUTPATIENT_CLINIC_OR_DEPARTMENT_OTHER): Payer: Self-pay | Admitting: Family Medicine

## 2023-03-19 ENCOUNTER — Other Ambulatory Visit (HOSPITAL_BASED_OUTPATIENT_CLINIC_OR_DEPARTMENT_OTHER): Payer: Medicaid Other

## 2023-03-19 DIAGNOSIS — E559 Vitamin D deficiency, unspecified: Secondary | ICD-10-CM

## 2023-03-19 MED ORDER — VITAMIN D (ERGOCALCIFEROL) 1.25 MG (50000 UNIT) PO CAPS
50000.0000 [IU] | ORAL_CAPSULE | ORAL | 1 refills | Status: DC
Start: 2023-03-19 — End: 2023-09-24

## 2023-03-19 NOTE — Progress Notes (Signed)
Vit D improving. Refilled Vit D Rx for patient and notified to restart therapy.

## 2023-03-31 ENCOUNTER — Other Ambulatory Visit (HOSPITAL_BASED_OUTPATIENT_CLINIC_OR_DEPARTMENT_OTHER): Payer: Medicaid Other

## 2023-03-31 DIAGNOSIS — Z0000 Encounter for general adult medical examination without abnormal findings: Secondary | ICD-10-CM

## 2023-04-01 LAB — LIPID PANEL
Chol/HDL Ratio: 3.2 ratio (ref 0.0–4.4)
Cholesterol, Total: 146 mg/dL (ref 100–199)
HDL: 45 mg/dL (ref >39)
LDL Chol Calc (NIH): 78 mg/dL (ref 0–99)
Triglycerides: 127 mg/dL (ref 0–149)
VLDL Cholesterol Cal: 23 mg/dL (ref 5–40)

## 2023-04-01 LAB — CBC WITH DIFFERENTIAL/PLATELET
Basophils Absolute: 0 10*3/uL (ref 0.0–0.2)
Basos: 1 %
EOS (ABSOLUTE): 0.1 10*3/uL (ref 0.0–0.4)
Eos: 1 %
Hematocrit: 40.5 % (ref 34.0–46.6)
Hemoglobin: 12.3 g/dL (ref 11.1–15.9)
Immature Grans (Abs): 0 10*3/uL (ref 0.0–0.1)
Immature Granulocytes: 0 %
Lymphocytes Absolute: 2 10*3/uL (ref 0.7–3.1)
Lymphs: 27 %
MCH: 22.9 pg — ABNORMAL LOW (ref 26.6–33.0)
MCHC: 30.4 g/dL — ABNORMAL LOW (ref 31.5–35.7)
MCV: 76 fL — ABNORMAL LOW (ref 79–97)
Monocytes Absolute: 0.7 10*3/uL (ref 0.1–0.9)
Monocytes: 10 %
Neutrophils Absolute: 4.5 10*3/uL (ref 1.4–7.0)
Neutrophils: 61 %
Platelets: 281 10*3/uL (ref 150–450)
RBC: 5.36 x10E6/uL — ABNORMAL HIGH (ref 3.77–5.28)
RDW: 14.6 % (ref 11.7–15.4)
WBC: 7.4 10*3/uL (ref 3.4–10.8)

## 2023-04-01 LAB — HEMOGLOBIN A1C
Est. average glucose Bld gHb Est-mCnc: 111 mg/dL
Hgb A1c MFr Bld: 5.5 % (ref 4.8–5.6)

## 2023-04-02 ENCOUNTER — Ambulatory Visit
Admission: RE | Admit: 2023-04-02 | Discharge: 2023-04-02 | Disposition: A | Discharge status: Home / Self Care | Payer: Medicaid Other | Source: Ambulatory Visit | Attending: Family Medicine | Admitting: Family Medicine

## 2023-04-02 ENCOUNTER — Other Ambulatory Visit (HOSPITAL_BASED_OUTPATIENT_CLINIC_OR_DEPARTMENT_OTHER): Payer: Self-pay | Admitting: Family Medicine

## 2023-04-02 DIAGNOSIS — D509 Iron deficiency anemia, unspecified: Secondary | ICD-10-CM

## 2023-04-02 DIAGNOSIS — N63 Unspecified lump in unspecified breast: Secondary | ICD-10-CM

## 2023-04-02 NOTE — Progress Notes (Signed)
Please let pt know that we are awaiting her CMP to result. Overall her A1c is normal, her cholesterol is normal as well. Her blood counts indicate microcytic anemia which could be due to iron deficiency anemia. I could not see any previously performed iron labs drawn and would recommend lab work to rule out iron deficiency. If she is agreeable, please let me know and I can place the orders for her labs.

## 2023-04-02 NOTE — Progress Notes (Signed)
Patient's breast US negative for nodules or masses. Patient aware of result per Radiologist report. Will continue to follow with PCP.

## 2023-04-03 ENCOUNTER — Other Ambulatory Visit (HOSPITAL_BASED_OUTPATIENT_CLINIC_OR_DEPARTMENT_OTHER): Payer: Medicaid Other

## 2023-04-03 LAB — COMPREHENSIVE METABOLIC PANEL
ALT: 14 IU/L (ref 0–32)
AST: 17 IU/L (ref 0–40)
Albumin: 4.4 g/dL (ref 4.0–5.0)
Alkaline Phosphatase: 98 IU/L (ref 42–106)
BUN/Creatinine Ratio: 16 (ref 9–23)
BUN: 10 mg/dL (ref 6–20)
Bilirubin Total: 0.2 mg/dL (ref 0.0–1.2)
CO2: 17 mmol/L — ABNORMAL LOW (ref 20–29)
Calcium: 9.1 mg/dL (ref 8.7–10.2)
Chloride: 103 mmol/L (ref 96–106)
Creatinine, Ser: 0.62 mg/dL (ref 0.57–1.00)
Globulin, Total: 3.2 g/dL (ref 1.5–4.5)
Glucose: 92 mg/dL (ref 70–99)
Potassium: 5.2 mmol/L (ref 3.5–5.2)
Sodium: 141 mmol/L (ref 134–144)
Total Protein: 7.6 g/dL (ref 6.0–8.5)
eGFR: 131 mL/min/{1.73_m2} (ref >59)

## 2023-04-03 LAB — SPECIMEN STATUS REPORT

## 2023-04-04 LAB — IRON,TIBC AND FERRITIN PANEL
Ferritin: 8 ng/mL — ABNORMAL LOW (ref 15–150)
Iron Saturation: 6 % — CL (ref 15–55)
Iron: 24 ug/dL — ABNORMAL LOW (ref 27–159)
Total Iron Binding Capacity: 379 ug/dL (ref 250–450)
UIBC: 355 ug/dL (ref 131–425)

## 2023-04-06 NOTE — Progress Notes (Signed)
Your iron level is significantly low. This can cause fatigue and feeling tired. This is likely due to your monthly period and chronic blood loss. I would like you to start an over the counter iron supplement such as SlowFE daily and we can recheck your levels in approx. 3-4 months. If you would like me to send in a iron prescription for you let me know. It is best to take the iron supplement with Vitamin C such as orange juice or a Vitamin C supplement. You can take with food if you need to.

## 2023-04-06 NOTE — Progress Notes (Signed)
 CMP within normal limits.

## 2023-04-07 ENCOUNTER — Ambulatory Visit (HOSPITAL_COMMUNITY): Payer: Medicaid Other | Admitting: Mental Health

## 2023-04-09 ENCOUNTER — Encounter (HOSPITAL_COMMUNITY): Payer: Self-pay | Admitting: Psychiatry

## 2023-04-09 ENCOUNTER — Ambulatory Visit (INDEPENDENT_AMBULATORY_CARE_PROVIDER_SITE_OTHER): Payer: Medicaid Other | Admitting: Psychiatry

## 2023-04-09 VITALS — BP 138/84 | HR 106 | Temp 98.7°F | Wt >= 6400 oz

## 2023-04-09 DIAGNOSIS — F3181 Bipolar II disorder: Secondary | ICD-10-CM

## 2023-04-09 MED ORDER — ARIPIPRAZOLE 5 MG PO TABS
5.0000 mg | ORAL_TABLET | Freq: Every day | ORAL | 3 refills | Status: DC
Start: 2023-04-09 — End: 2023-07-09

## 2023-04-09 NOTE — Progress Notes (Signed)
BH MD/PA/NP OP Progress Note Virtual Visit via Video Note  I connected with Ashley Cantu on 04/09/23 at  1:00 PM EDT by a video enabled telemedicine application and verified that I am speaking with the correct person using two identifiers.  Location: Patient: Home Provider: Clinic   I discussed the limitations of evaluation and management by telemedicine and the availability of in person appointments. The patient expressed understanding and agreed to proceed.  I provided 30 minutes of non-face-to-face time during this encounter.     04/09/2023 1:38 PM Ashley Cantu  MRN:  [id number]  Chief Complaint: "I've been busy"   HPI:21 y/o female seen today for follow up psychiatric evaluation. She has a psychiatric history of GAD, ADHD, Bipolar II disorder and OCD. Patient is currently prescribed Abilify 5 mg daily, and trazodone 50mg  nightly.  She notes that she takes trazodone infrequently.  She reports that her medications are effective in managing her psychiatric conditions.  Today she was well-groomed, pleasant, cooperative, and engaged in conversation.  She informed Clinical research associate that she has been busy.  Patient reports that she works at the [employer].  She also recently started going to school where she is studying accounting and finances.  Patient informed Clinical research associate that she feels restless when she has so many tasks to complete.  Provider asked patient if she was experiencing inner restlessness and she notes that she was not.  She however reports that she shakes her legs to distract her from all she has to do.  At times she notes that she twisted her arms and her wrist with distraction.  Provider conducted an aims assessment patient scored a 0.   Since her last visit patient informed writer that her anxiety and depression continues to be well-managed.  Today provider conducted a GAD-7 and patient scored a 6, at her last visit she scored a 4.  Provider also conducted PHQ-9  patient 6, at her last visit she scored a 4.  She endorses adequate sleep however notes that her appetite fluctuates.  Patient informed Clinical research associate at times she binges and other times she does not have time to eat.  She denies purging behaviors.  Today she denies SI/HI/VH patient response.  She however notes that she needs to to have her eyes examined as she has not seen an ophthalmologist.  She also notes at times she experiences ringing in her ear.   Today provider referred patient to nutrition diabetic management for binging behaviors.  At this time trazodone not refilled as patient reports that she takes it infrequently. She will continue Abilify as prescribed. No other concerns at this time.   Visit Diagnosis:    ICD-10-CM   1. 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   Asthma when she was a baby   Bone spur    Bronchiectasis (HCC)    Cholelithiasis 04/01/2017   Cough 10/15/2018   Depression    Environmental allergies    Gallbladder problem    GERD (gastroesophageal reflux disease)    Headache    Heartburn    IBS (irritable bowel syndrome)  Joint pain    Lactose intolerance    Migraine variant    Obesity    PONV (postoperative nausea and vomiting)    with the rhinoplasty surgery   Vitamin D deficiency     Past Surgical History:  Procedure Laterality Date   CHOLECYSTECTOMY N/A 04/01/2017   Procedure: LAPAROSCOPIC CHOLECYSTECTOMY;  Surgeon: Kandice Hams, MD;  Location: [facility];  Service: General;  Laterality: N/A;   RHINOPLASTY     VIDEO BRONCHOSCOPY WITH ENDOBRONCHIAL NAVIGATION N/A 07/11/2020   Procedure: VIDEO BRONCHOSCOPY WITH ENDOBRONCHIAL NAVIGATION  WITH  INDOCYANINE GREEN/METHYLENE BLUE MARKING INJECTION OF RIGHT UPPER LOBE MASS;  Surgeon: Delight Ovens, MD;  Location: [facility] OR;  Service: Thoracic;  Laterality: N/A;    Family Psychiatric History: Mother Schizoaffective Bipolar type  Family History:  Family History  Problem Relation Age of Onset   Hypertension Mother    Mental illness Mother    Depression Mother    Bipolar disorder Mother    Schizophrenia Mother    Heart disease Mother    Liver disease Mother    Alcohol abuse Mother    Drug abuse Mother    Obesity Mother    Asthma Mother    Migraines Mother    High blood pressure Father    Depression Father    Sleep apnea Father    Obesity Father    Asthma Father    Migraines Father    COPD Maternal Grandmother    Asthma Maternal Grandmother    Breast cancer Maternal Grandmother    Schizophrenia Maternal Grandfather    Bipolar disorder Maternal Grandfather    Asthma Maternal Grandfather    Seizures Paternal Grandmother    Atrial fibrillation Paternal Grandmother    Asthma Paternal Grandmother    Breast cancer Paternal Grandmother    Diabetes Paternal Grandfather    Congestive Heart Failure Paternal Grandfather    Asthma Paternal Grandfather    Breast cancer Paternal Aunt 84    Social History:  Social History   Socioeconomic History   Marital status: Single    Spouse name: Not on file   Number of children: Not on file   Years of education: Not on file   Highest education level: Not on file  Occupational History   Occupation: Conservation officer, nature  Tobacco Use   Smoking status: Never    Passive exposure: Yes   Smokeless tobacco: Never  Vaping Use   Vaping status: Never Used  Substance and Sexual Activity   Alcohol use: No   Drug use: No   Sexual activity: Never    Birth control/protection: Abstinence  Other Topics Concern   Not on file  Social History Narrative   Ashley Cantu received her GED.   She attended [employer].   She lives with her dad only.   She has a younger  brother.   She is ambidextrous.   1 8oz cup caffeine daily   Social Determinants of Health   Financial Resource Strain: High Risk (03/17/2023)   Overall Financial Resource Strain (CARDIA)    Difficulty of Paying Living Expenses: Hard  Food Insecurity: No Food Insecurity (03/17/2023)   Hunger Vital Sign    Worried About Running Out of Food in the Last Year: Never true    Ran Out of Food in the Last Year: Never true  Transportation Needs: No Transportation Needs (03/17/2023)   PRAPARE - Administrator, Civil Service (Medical): No    Lack of Transportation (Non-Medical): No  Physical Activity: Sufficiently  Active (03/17/2023)   Exercise Vital Sign    Days of Exercise per Week: 4 days    Minutes of Exercise per Session: 40 min  Stress: No Stress Concern Present (03/17/2023)   [employer] of Occupational Health - Occupational Stress Questionnaire    Feeling of Stress : Only a little  Social Connections: Socially Isolated (03/17/2023)   Social Connection and Isolation Panel [NHANES]    Frequency of Communication with Friends and Family: More than three times a week    Frequency of Social Gatherings with Friends and Family: Twice a week    Attends Religious Services: Never    Database administrator or Organizations: No    Attends Banker Meetings: Never    Marital Status: Never married    Allergies:  Allergies  Allergen Reactions   Penicillins Anaphylaxis, Hives and Swelling    Has patient had a PCN reaction causing immediate rash, facial/tongue/throat swelling, SOB or lightheadedness with hypotension: yes Has patient had a PCN reaction causing severe rash involving mucus membranes or skin necrosis: no Has patient had a PCN reaction that required hospitalization: no Has patient had a PCN reaction occurring within the last 10 years: bo If all of the above answers are "NO", then may proceed with Cephalosporin use.     Metabolic Disorder Labs: Lab Results   Component Value Date   HGBA1C 5.5 03/31/2023   No results found for: "PROLACTIN" Lab Results  Component Value Date   CHOL 146 03/31/2023   TRIG 127 03/31/2023   HDL 45 03/31/2023   CHOLHDL 3.2 03/31/2023   LDLCALC 78 03/31/2023   LDLCALC 79 03/05/2022   Lab Results  Component Value Date   TSH 2.880 03/05/2022   TSH 1.410 04/24/2020    Therapeutic Level Labs: No results found for: "LITHIUM" No results found for: "VALPROATE" No results found for: "CBMZ"  Current Medications: Current Outpatient Medications  Medication Sig Dispense Refill   albuterol (VENTOLIN HFA) 108 (90 Base) MCG/ACT inhaler Inhale 2 puffs into the lungs every 6 (six) hours as needed for wheezing or shortness of breath. 18 g 1   ARIPiprazole (ABILIFY) 5 MG tablet Take 1 tablet (5 mg total) by mouth daily. 30 tablet 3   cetirizine (ZYRTEC) 10 MG tablet Take 1 tablet (10 mg total) by mouth daily as needed. 90 tablet 1   esomeprazole (NEXIUM) 40 MG capsule TAKE 1 CAPSULE(40 MG) BY MOUTH DAILY 90 capsule 1   fluticasone (FLONASE) 50 MCG/ACT nasal spray Place 2 sprays into both nostrils daily as needed. 18.2 mL 1   montelukast (SINGULAIR) 10 MG tablet Take 1 tablet (10 mg total) by mouth at bedtime. 90 tablet 3   Vitamin D, Ergocalciferol, (DRISDOL) 1.25 MG (50000 UNIT) CAPS capsule Take 1 capsule (50,000 Units total) by mouth every 7 (seven) days. 12 capsule 1   No current facility-administered medications for this visit.     Musculoskeletal: Strength & Muscle Tone: within normal limits Gait & Station: normal Patient leans: N/A  Psychiatric Specialty Exam: Review of Systems  Last menstrual period 03/16/2023.There is no height or weight on file to calculate BMI.  General Appearance: Well Groomed  Eye Contact:  Good  Speech:  Clear and Coherent and Normal Rate  Volume:  Normal  Mood:  Euthymic  Affect:  Appropriate and Congruent  Thought Process:  Coherent, Goal Directed and Linear  Orientation:  Full  (Time, Place, and Person)  Thought Content: WDL and Logical , ringing in ear  Suicidal Thoughts:  No  Homicidal Thoughts:  No  Memory:  Immediate;   Good Recent;   Good Remote;   Good  Judgement:  Good  Insight:  Good  Psychomotor Activity:  Normal  Concentration:  Concentration: Good and Attention Span: Good  Recall:  Good  Fund of Knowledge: Good  Language: Good  Akathisia:  No  Handed:  Right  AIMS (if indicated):  done, 0  Assets:  Communication Skills Desire for Improvement Financial Resources/Insurance Housing Social Support  ADL's:  Intact  Cognition: WNL  Sleep:  Good   Screenings: AIMS    Flowsheet Row Clinical Support from 04/09/2023 in [facility]  AIMS Total Score 0      GAD-7    Flowsheet Row Clinical Support from 04/09/2023 in [facility] Office Visit from 03/17/2023 in [facility] Primary Care & Sports Medicine at [facility] Video Visit from 01/22/2023 in [facility] Office Visit from 12/11/2022 in [facility] Primary Care & Sports Medicine at [facility] Clinical Support from 10/29/2022 in [facility]  Total GAD-7 Score 6 5 4  0 2      PHQ2-9    Flowsheet Row Clinical Support from 04/09/2023 in [facility] Office Visit from 03/17/2023 in [facility] Primary Care & Sports Medicine at [facility] Video Visit from 01/22/2023 in [facility] Office Visit from 12/11/2022 in [facility] Primary Care & Sports Medicine at [facility] Clinical Support from 10/29/2022 in [facility]  PHQ-2 Total Score 2 2 1  0 0  PHQ-9 Total Score 6 12 4  -- 3      Flowsheet Row Counselor from 08/19/2022 in [facility] Clinical Support from 07/29/2022 in [facility] Video Visit from 03/12/2021 in [facility]  C-SSRS RISK CATEGORY No Risk Error: Q7 should not be populated when Q6 is No No Risk        Assessment and Plan: Patient reports that at times she is restless.  She informed Clinical research associate that her restlessness is a coping mechanism she has so many things to do.  Provider conducted an aims assessment patient scored a 0.  No noted signs of akathisia.  Patient also informed Clinical research associate that she binge eats at time.  Today provider referred patient to nutrition diabetic management for binging behaviors.  At this time trazodone not refilled as patient reports that she takes it infrequently. She will continue Abilify as prescribed  1. Bipolar 2 disorder, major depressive episode (HCC)  Continue- ARIPiprazole (ABILIFY) 5 MG tablet; Take 1 tablet (5 mg total) by mouth daily.  Dispense: 30 tablet; Refill: 3  2. Class 3 severe obesity in adult, unspecified BMI, unspecified obesity type, unspecified whether serious comorbidity present [facility])  - Ambulatory referral to Nutrition and Diabetic Education      Follow-up in 3 month Follow-up with therapy  Shanna Cisco, NP 04/09/2023, 1:38 PM

## 2023-04-13 ENCOUNTER — Ambulatory Visit (HOSPITAL_BASED_OUTPATIENT_CLINIC_OR_DEPARTMENT_OTHER): Payer: Medicaid Other | Admitting: Family Medicine

## 2023-05-26 ENCOUNTER — Ambulatory Visit (INDEPENDENT_AMBULATORY_CARE_PROVIDER_SITE_OTHER): Payer: Medicaid Other | Admitting: Mental Health

## 2023-05-26 DIAGNOSIS — F3181 Bipolar II disorder: Secondary | ICD-10-CM

## 2023-05-26 DIAGNOSIS — F411 Generalized anxiety disorder: Secondary | ICD-10-CM

## 2023-05-26 DIAGNOSIS — F902 Attention-deficit hyperactivity disorder, combined type: Secondary | ICD-10-CM

## 2023-05-26 NOTE — Progress Notes (Signed)
   THERAPIST PROGRESS NOTE  Session Time: 11:01 am ( 53 minutes)  Participation Level: Active  Behavioral Response: CasualAlertEuthymic  Type of Therapy: Individual Therapy  Treatment Goals addressed: STG: Ashley Cantu increase management of moods(depressive/manic episodes) AEB development of effective coping skills with ability to reframe maladaptive thinking within the next 6 months    STG: Ashley Cantu will increase management of stressors AEB development of effective decision making skills with ability to identify realistic vs unrealistic stressors within the next 6 months   ProgressTowards Goals: Progressing  Interventions: CBT and Supportive  Summary: Ashley Cantu is a 22 y.o. female who presents with dx of bipolar disorder, GAD and ADHD. Ashley Cantu presents alert and oriented. Smiles and laughs. Engaged and receptive to interventions. Mood and affect adequate; euthymic. Pleasant demeanor. Shares to for moods to have been stable and denies excessive highs or lows. Notes to have started school and needs to work on improvement of time management skills with stressors related to completing school work at the last minute prior to it being due. Shares aware may be able to do better academically if she was not rushing and notes grades could be better. Shares ongoing work full time as well as part time work as well. Processing with therapist working to managing routine to support high degree of tasks. Shares concerning family dynamics and working to meet her needs with hx of being overly involved with attending to and supporting family. Shares thoughts of jealousy of brother feeling he is the favorite amongst his father and shares for brother to adopted. Processed working to focus on her self and personal goals and adequate adult choices. Denies SI/HI. Shares for moods to be doing well, use of coping skills with engaging in enjoyable activities as able, balanced thinking reported, with no distorted thoughts.  Progress with goals; sxs stable.    Suicidal/Homicidal: Nowithout intent/plan  Therapist Response:  Therapist engaged Ashley Cantu in therapy session. Check in and assessed for current level of functioning, sxs management and level of stressors. Explored with [name] factors that have support in increase in level of functioning. Explored ability to navigate increase in tasks with school and work and family responsibilities. Explored use of planner and other tools that can support in establishing routine and monitoring schedule. Supported in processing family concerns and feelings of favoritism towards brother. Explored ability to engage in balanced thinking and presence of distorted thoughts; reviewing realistic vs. Unrealistic stressors. Reviewed use of coping skills. Explored decision making skills and ability to attend to personal goals. Reviewed session and provided follow up.   Plan: Return again in  x 6 weeks.  Diagnosis: Bipolar 2 disorder, major depressive episode (HCC)  Generalized anxiety disorder  Attention deficit hyperactivity disorder (ADHD), combined type  Collaboration of Care: Other None  Patient/Guardian was advised Release of Information must be obtained prior to any record release in order to collaborate their care with an outside provider. Patient/Guardian was advised if they have not already done so to contact the registration department to sign all necessary forms in order for [place] to release information regarding their care.   Consent: Patient/Guardian gives verbal consent for treatment and assignment of benefits for services provided during this visit. Patient/Guardian expressed understanding and agreed to proceed.   Stephan Minister [place], [facility] 05/26/2023

## 2023-06-17 ENCOUNTER — Encounter (HOSPITAL_BASED_OUTPATIENT_CLINIC_OR_DEPARTMENT_OTHER): Payer: Medicaid Other | Admitting: Obstetrics & Gynecology

## 2023-06-18 ENCOUNTER — Other Ambulatory Visit (HOSPITAL_BASED_OUTPATIENT_CLINIC_OR_DEPARTMENT_OTHER): Payer: Self-pay | Admitting: Certified Nurse Midwife

## 2023-06-20 ENCOUNTER — Other Ambulatory Visit (HOSPITAL_BASED_OUTPATIENT_CLINIC_OR_DEPARTMENT_OTHER): Payer: Self-pay | Admitting: Family Medicine

## 2023-06-20 DIAGNOSIS — K219 Gastro-esophageal reflux disease without esophagitis: Secondary | ICD-10-CM

## 2023-07-01 ENCOUNTER — Ambulatory Visit: Payer: Medicaid Other | Admitting: Dietician

## 2023-07-06 ENCOUNTER — Other Ambulatory Visit (HOSPITAL_COMMUNITY)
Admission: RE | Admit: 2023-07-06 | Discharge: 2023-07-06 | Disposition: A | Discharge status: Home / Self Care | Payer: Medicaid Other | Source: Ambulatory Visit | Attending: Certified Nurse Midwife | Admitting: Certified Nurse Midwife

## 2023-07-06 ENCOUNTER — Ambulatory Visit (HOSPITAL_BASED_OUTPATIENT_CLINIC_OR_DEPARTMENT_OTHER): Payer: Medicaid Other | Admitting: Certified Nurse Midwife

## 2023-07-06 ENCOUNTER — Encounter (HOSPITAL_BASED_OUTPATIENT_CLINIC_OR_DEPARTMENT_OTHER): Payer: Self-pay | Admitting: Certified Nurse Midwife

## 2023-07-06 VITALS — BP 143/77 | HR 76 | Ht 68.0 in | Wt >= 6400 oz

## 2023-07-06 DIAGNOSIS — Z124 Encounter for screening for malignant neoplasm of cervix: Secondary | ICD-10-CM | POA: Diagnosis present

## 2023-07-06 DIAGNOSIS — Z23 Encounter for immunization: Secondary | ICD-10-CM | POA: Diagnosis not present

## 2023-07-06 DIAGNOSIS — Z01419 Encounter for gynecological examination (general) (routine) without abnormal findings: Secondary | ICD-10-CM

## 2023-07-06 NOTE — Progress Notes (Signed)
22 y.o. G0P0000 Single White or Caucasian female here for annual exam.  She works at [employer] near [employer]. Enjoys movies. She has never been sexually active and has no desire for STI screening. Pt thinks she had HPV vaccines.   Patient's last menstrual period was 06/08/2023 (approximate).          Sexually active: never  The current method of family planning is abstinence.    Reports her periods are monthly, very regular and denies dysmenorrhea or menorrhagia.   Upstream - 07/06/23 0913       Pregnancy Intention Screening   Does the patient want to become pregnant in the next year? No    Does the patient's partner want to become pregnant in the next year? No      Contraception Wrap Up   Current Method Abstinence    Contraception Counseling Provided No            The pregnancy intention screening data noted above was reviewed. Potential methods of contraception were discussed. The patient elected to proceed with No data recorded.  Exercising: Yes.     Smoker:  no  Health Maintenance: Pap:  1st pap smear collected today   reports that she has never smoked. She has been exposed to tobacco smoke. She has never used smokeless tobacco. She reports that she does not drink alcohol and does not use drugs.  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   Asthma when she was a baby   Bone spur    Bronchiectasis (HCC)    Cholelithiasis 04/01/2017   Cough 10/15/2018   Depression    Environmental allergies    Gallbladder problem    GERD (gastroesophageal reflux disease)    Headache    Heartburn    IBS (irritable bowel syndrome)    Joint pain    Lactose intolerance    Migraine variant    Obesity    PONV (postoperative nausea and vomiting)    with the rhinoplasty surgery   Vitamin D deficiency     Past Surgical  History:  Procedure Laterality Date   CHOLECYSTECTOMY N/A 04/01/2017   Procedure: LAPAROSCOPIC CHOLECYSTECTOMY;  Surgeon: Kandice Hams, MD;  Location: [facility] OR;  Service: General;  Laterality: N/A;   RHINOPLASTY     VIDEO BRONCHOSCOPY WITH ENDOBRONCHIAL NAVIGATION N/A 07/11/2020   Procedure: VIDEO BRONCHOSCOPY WITH ENDOBRONCHIAL NAVIGATION  WITH INDOCYANINE GREEN/METHYLENE BLUE MARKING INJECTION OF RIGHT UPPER LOBE MASS;  Surgeon: Delight Ovens, MD;  Location: [facility] OR;  Service: Thoracic;  Laterality: N/A;    Current Outpatient Medications  Medication Sig Dispense Refill   albuterol (VENTOLIN HFA) 108 (90 Base) MCG/ACT inhaler Inhale 2 puffs into the lungs every 6 (six) hours as needed for wheezing or shortness of breath. 18 g 1   ARIPiprazole (ABILIFY) 5 MG tablet Take 1 tablet (5 mg total) by mouth daily. 30 tablet 3   cetirizine (ZYRTEC) 10 MG tablet Take 1 tablet (10 mg total) by mouth daily as needed. 90 tablet 1   esomeprazole (NEXIUM) 40 MG capsule TAKE 1 CAPSULE(40 MG) BY MOUTH DAILY 90 capsule 1   fluticasone (FLONASE) 50 MCG/ACT nasal spray Place 2 sprays into both nostrils daily as needed. 18.2 mL 1   montelukast (SINGULAIR) 10 MG tablet Take 1  tablet (10 mg total) by mouth at bedtime. 90 tablet 3   Vitamin D, Ergocalciferol, (DRISDOL) 1.25 MG (50000 UNIT) CAPS capsule Take 1 capsule (50,000 Units total) by mouth every 7 (seven) days. 12 capsule 1   No current facility-administered medications for this visit.    Family History  Problem Relation Age of Onset   Hypertension Mother    Mental illness Mother    Depression Mother    Bipolar disorder Mother    Schizophrenia Mother    Heart disease Mother    Liver disease Mother    Alcohol abuse Mother    Drug abuse Mother    Obesity Mother    Asthma Mother    Migraines Mother    High blood pressure Father    Depression Father    Sleep apnea Father    Obesity Father    Asthma Father    Migraines Father    COPD Maternal  Grandmother    Asthma Maternal Grandmother    Breast cancer Maternal Grandmother    Schizophrenia Maternal Grandfather    Bipolar disorder Maternal Grandfather    Asthma Maternal Grandfather    Seizures Paternal Grandmother    Atrial fibrillation Paternal Grandmother    Asthma Paternal Grandmother    Breast cancer Paternal Grandmother    Diabetes Paternal Grandfather    Congestive Heart Failure Paternal Grandfather    Asthma Paternal Grandfather    Breast cancer Paternal Aunt 35   ROS: Constitutional: negative Genitourinary:negative  Exam:   BP (!) 143/77 (BP Location: Right Wrist, Patient Position: Sitting, Cuff Size: Large)   Pulse 76   Ht 5\' 8"  (1.727 m)   Wt (!) 502 lb (227.7 kg)   LMP 06/08/2023 (Approximate)   BMI 76.33 kg/m   Height: 5\' 8"  (172.7 cm)  General appearance: alert, cooperative and appears stated age Head: Normocephalic, without obvious abnormality, atraumatic Neck: no adenopathy, supple, symmetrical Lungs: normal respiratory rate Breasts: normal appearance, no masses or tenderness, Inspection negative, No nipple retraction or dimpling, No nipple discharge or bleeding, No axillary or supraclavicular adenopathy, Normal to palpation without dominant masses Heart: regular rate and rhythm Abdomen: soft, non-tender; bowel sounds normal; no masses,  no organomegaly, exam limited by body habitus Extremities: extremities normal, atraumatic, no cyanosis or edema Skin: Skin color, texture, turgor normal. No rashes or lesions Lymph nodes: Cervical, supraclavicular, and axillary nodes normal. No abnormal inguinal nodes palpated Neurologic: Grossly normal   Pelvic: External genitalia:  no lesions              Urethra:  normal appearing urethra with no masses, tenderness or lesions              Bartholins and Skenes: normal                 Vagina: normal appearing vagina with normal color and no discharge, no lesions              Cervix: no bleeding following Pap  and no cervical motion tenderness              Pap taken: Yes.   Bimanual Exam:  Uterus:  normal and exam limited              Adnexa: no mass, fullness, tenderness               Rectovaginal: Confirms               Anus:  normal sphincter tone, no lesions  Chaperone, Hendricks Milo, CMA, was present for exam.  Assessment/Plan:  1. Screening for cervical cancer - Cervicovaginal ancillary only( [place])  2. Need for influenza vaccination - Flu vaccine trivalent PF, 6mos and older(Flulaval,Afluria,Fluarix,Fluzone)  3. Encounter for annual routine gynecological examination  No risk for STI exposure, pt declines screening Discussed availability of HPV Vaccines if desired Followed by Primary Care Provider  RTO one year for annual gyn exam and prn if issues arise.  Letta Kocher

## 2023-07-06 NOTE — Addendum Note (Signed)
Addended byMerrilee Jansky on: 07/06/2023 11:21 AM   Modules accepted: Orders

## 2023-07-07 LAB — CYTOLOGY - PAP
Adequacy: ABSENT
Diagnosis: NEGATIVE

## 2023-07-09 ENCOUNTER — Telehealth (HOSPITAL_COMMUNITY): Payer: Medicaid Other | Admitting: Psychiatry

## 2023-07-09 ENCOUNTER — Other Ambulatory Visit (HOSPITAL_BASED_OUTPATIENT_CLINIC_OR_DEPARTMENT_OTHER): Payer: Self-pay | Admitting: Family Medicine

## 2023-07-09 ENCOUNTER — Encounter (HOSPITAL_COMMUNITY): Payer: Self-pay | Admitting: Psychiatry

## 2023-07-09 DIAGNOSIS — J3089 Other allergic rhinitis: Secondary | ICD-10-CM

## 2023-07-09 DIAGNOSIS — F3181 Bipolar II disorder: Secondary | ICD-10-CM

## 2023-07-09 MED ORDER — ARIPIPRAZOLE 5 MG PO TABS
5.0000 mg | ORAL_TABLET | Freq: Every day | ORAL | 3 refills | Status: DC
Start: 2023-07-09 — End: 2023-09-28

## 2023-07-09 NOTE — Progress Notes (Signed)
BH MD/PA/NP OP Progress Note Virtual Visit via Video Note  I connected with Ashley Cantu on 07/09/23 at  1:30 PM EDT by a video enabled telemedicine application and verified that I am speaking with the correct person using two identifiers.  Location: Patient: Home Provider: Clinic   I discussed the limitations of evaluation and management by telemedicine and the availability of in person appointments. The patient expressed understanding and agreed to proceed.  I provided 30 minutes of non-face-to-face time during this encounter.     07/09/2023 2:09 PM Ashley Cantu  MRN:  [id number]  Chief Complaint: "School is my focus"   HPI:22 y/o female seen today for follow up psychiatric evaluation. She has a psychiatric history of GAD, ADHD, Bipolar II disorder and OCD. Patient is currently prescribed Abilify 5 mg daily. She reports that Abilify is are effective in managing her psychiatric conditions.  Today she was well-groomed, pleasant, cooperative, and engaged in conversation.  She informed Clinical research associate that her focus is school. She continues to study accounting and finances. She reports that she is in the routine of her coarse's and just started her forth class.    Since her last visit patient informed writer that her anxiety and depression continues to be well-managed.  Today provider conducted a GAD-7 and patient scored a 5, at her last visit she scored a 6.  Provider also conducted PHQ-9 patient 5, at her last visit she scored a 6.  She endorses adequate sleep and appetite.  Today she denies SI/HI/VAH, mania or paranoia.   No medication changes made today. She will continue Abilify as prescribed. No other concerns at this time.   Visit Diagnosis:    ICD-10-CM   1. 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   Asthma when she was a baby   Bone spur    Bronchiectasis (HCC)    Cholelithiasis 04/01/2017   Cough 10/15/2018   Depression    Environmental allergies    Gallbladder problem    GERD (gastroesophageal reflux disease)    Headache    Heartburn    IBS (irritable bowel syndrome)    Joint pain    Lactose intolerance    Migraine variant    Obesity    PONV (postoperative nausea and vomiting)    with the rhinoplasty surgery   Vitamin D deficiency     Past Surgical History:  Procedure Laterality Date   CHOLECYSTECTOMY N/A 04/01/2017   Procedure: LAPAROSCOPIC CHOLECYSTECTOMY;  Surgeon: Kandice Hams, MD;  Location: [facility] OR;  Service: General;  Laterality: N/A;   RHINOPLASTY     VIDEO BRONCHOSCOPY WITH ENDOBRONCHIAL NAVIGATION N/A 07/11/2020   Procedure: VIDEO BRONCHOSCOPY WITH ENDOBRONCHIAL NAVIGATION  WITH INDOCYANINE GREEN/METHYLENE BLUE MARKING INJECTION OF RIGHT UPPER LOBE MASS;  Surgeon: Delight Ovens, MD;  Location: [facility] OR;  Service: Thoracic;  Laterality: N/A;    Family Psychiatric History: Mother Schizoaffective Bipolar type  Family History:  Family History  Problem Relation Age of Onset   Hypertension Mother    Mental illness Mother    Depression Mother    Bipolar disorder Mother  Schizophrenia Mother    Heart disease Mother    Liver disease Mother    Alcohol abuse Mother    Drug abuse Mother    Obesity Mother    Asthma Mother    Migraines Mother    High blood pressure Father    Depression Father    Sleep apnea Father    Obesity Father    Asthma Father    Migraines Father    COPD Maternal Grandmother    Asthma Maternal Grandmother    Breast cancer Maternal Grandmother    Schizophrenia Maternal Grandfather    Bipolar disorder Maternal Grandfather    Asthma Maternal Grandfather    Seizures  Paternal Grandmother    Atrial fibrillation Paternal Grandmother    Asthma Paternal Grandmother    Breast cancer Paternal Grandmother    Diabetes Paternal Grandfather    Congestive Heart Failure Paternal Grandfather    Asthma Paternal Grandfather    Breast cancer Paternal Aunt 61    Social History:  Social History   Socioeconomic History   Marital status: Single    Spouse name: Not on file   Number of children: Not on file   Years of education: Not on file   Highest education level: Not on file  Occupational History   Occupation: Conservation officer, nature  Tobacco Use   Smoking status: Never    Passive exposure: Yes   Smokeless tobacco: Never  Vaping Use   Vaping status: Never Used  Substance and Sexual Activity   Alcohol use: No   Drug use: No   Sexual activity: Never    Birth control/protection: Abstinence  Other Topics Concern   Not on file  Social History Narrative   Ahmani received her GED.   She attended [employer].   She lives with her dad only.   She has a younger brother.   She is ambidextrous.   1 8oz cup caffeine daily   Social Determinants of Health   Financial Resource Strain: High Risk (03/17/2023)   Overall Financial Resource Strain (CARDIA)    Difficulty of Paying Living Expenses: Hard  Food Insecurity: No Food Insecurity (03/17/2023)   Hunger Vital Sign    Worried About Running Out of Food in the Last Year: Never true    Ran Out of Food in the Last Year: Never true  Transportation Needs: No Transportation Needs (03/17/2023)   PRAPARE - Administrator, Civil Service (Medical): No    Lack of Transportation (Non-Medical): No  Physical Activity: Sufficiently Active (03/17/2023)   Exercise Vital Sign    Days of Exercise per Week: 4 days    Minutes of Exercise per Session: 40 min  Stress: No Stress Concern Present (03/17/2023)   [employer] of Occupational Health - Occupational Stress Questionnaire    Feeling of Stress : Only a little  Social Connections:  Socially Isolated (03/17/2023)   Social Connection and Isolation Panel [NHANES]    Frequency of Communication with Friends and Family: More than three times a week    Frequency of Social Gatherings with Friends and Family: Twice a week    Attends Religious Services: Never    Database administrator or Organizations: No    Attends Banker Meetings: Never    Marital Status: Never married    Allergies:  Allergies  Allergen Reactions   Penicillins Anaphylaxis, Hives and Swelling    Has patient had a PCN reaction causing immediate rash, facial/tongue/throat swelling, SOB or lightheadedness with hypotension: yes Has  patient had a PCN reaction causing severe rash involving mucus membranes or skin necrosis: no Has patient had a PCN reaction that required hospitalization: no Has patient had a PCN reaction occurring within the last 10 years: bo If all of the above answers are "NO", then may proceed with Cephalosporin use.     Metabolic Disorder Labs: Lab Results  Component Value Date   HGBA1C 5.5 03/31/2023   No results found for: "PROLACTIN" Lab Results  Component Value Date   CHOL 146 03/31/2023   TRIG 127 03/31/2023   HDL 45 03/31/2023   CHOLHDL 3.2 03/31/2023   LDLCALC 78 03/31/2023   LDLCALC 79 03/05/2022   Lab Results  Component Value Date   TSH 2.880 03/05/2022   TSH 1.410 04/24/2020    Therapeutic Level Labs: No results found for: "LITHIUM" No results found for: "VALPROATE" No results found for: "CBMZ"  Current Medications: Current Outpatient Medications  Medication Sig Dispense Refill   albuterol (VENTOLIN HFA) 108 (90 Base) MCG/ACT inhaler Inhale 2 puffs into the lungs every 6 (six) hours as needed for wheezing or shortness of breath. 18 g 1   ARIPiprazole (ABILIFY) 5 MG tablet Take 1 tablet (5 mg total) by mouth daily. 30 tablet 3   cetirizine (ZYRTEC) 10 MG tablet TAKE 1 TABLET(10 MG) BY MOUTH DAILY AS NEEDED 90 tablet 1   esomeprazole (NEXIUM) 40 MG  capsule TAKE 1 CAPSULE(40 MG) BY MOUTH DAILY 90 capsule 1   fluticasone (FLONASE) 50 MCG/ACT nasal spray Place 2 sprays into both nostrils daily as needed. 18.2 mL 1   montelukast (SINGULAIR) 10 MG tablet Take 1 tablet (10 mg total) by mouth at bedtime. 90 tablet 3   Vitamin D, Ergocalciferol, (DRISDOL) 1.25 MG (50000 UNIT) CAPS capsule Take 1 capsule (50,000 Units total) by mouth every 7 (seven) days. 12 capsule 1   No current facility-administered medications for this visit.     Musculoskeletal: Strength & Muscle Tone: within normal limits and Telehealth visit Gait & Station: normal, Telehealth visit Patient leans: N/A  Psychiatric Specialty Exam: Review of Systems  Last menstrual period 06/08/2023.There is no height or weight on file to calculate BMI.  General Appearance: Well Groomed  Eye Contact:  Good  Speech:  Clear and Coherent and Normal Rate  Volume:  Normal  Mood:  Euthymic  Affect:  Appropriate and Congruent  Thought Process:  Coherent, Goal Directed and Linear  Orientation:  Full (Time, Place, and Person)  Thought Content: WDL and Logical   Suicidal Thoughts:  No  Homicidal Thoughts:  No  Memory:  Immediate;   Good Recent;   Good Remote;   Good  Judgement:  Good  Insight:  Good  Psychomotor Activity:  Normal  Concentration:  Concentration: Good and Attention Span: Good  Recall:  Good  Fund of Knowledge: Good  Language: Good  Akathisia:  No  Handed:  Right  AIMS (if indicated):  done, 0  Assets:  Communication Skills Desire for Improvement Financial Resources/Insurance Housing Social Support  ADL's:  Intact  Cognition: WNL  Sleep:  Good   Screenings: AIMS    Flowsheet Row Clinical Support from 04/09/2023 in [facility]  AIMS Total Score 0      GAD-7    Flowsheet Row Video Visit from 07/09/2023 in [facility] Clinical Support from 04/09/2023 in [facility] Office  Visit from 03/17/2023 in [facility] at [facility] Video Visit  from 01/22/2023 in [facility] Office Visit from 12[facility]11/2022 in [facility] Medicine at [facility]  Total GAD-7 Score 5 6 5 4  0      PHQ2-9    Flowsheet Row Video Visit from 07/09/2023 in [facility] Office Visit from 07/06/2023 in [facility] for [facility] Clinical Support from 04/09/2023 in [facility] Office Visit from 03/17/2023 in [facility] Primary Care & Sports Medicine at [facility] Video Visit from 01/22/2023 in [facility]  PHQ-2 Total Score 1 0 2 2 1   PHQ-9 Total Score 5 -- 6 12 4       Flowsheet Row Counselor from 08/19/2022 in [facility] Clinical Support from 07/29/2022 in [facility] Video [facility]  C-SSRS RISK CATEGORY No Risk Error: Q7 should not be populated when Q6 is No No Risk        Assessment and Plan: Patient reports that she is doing well on her current medication regimen.  No medication changes made today. Patient agreeable to continue Abilify as prescribed.   1. Bipolar 2 disorder, major depressive episode (HCC)  Continue- ARIPiprazole (ABILIFY) 5 MG tablet; Take 1 tablet (5 mg total) by mouth daily.  Dispense: 30 tablet; Refill: 3        Follow-up in 3 month Follow-up with therapy  Shanna Cisco, NP 07/09/2023, 2:09 PM

## 2023-07-14 ENCOUNTER — Ambulatory Visit (INDEPENDENT_AMBULATORY_CARE_PROVIDER_SITE_OTHER): Payer: Medicaid Other | Admitting: Mental Health

## 2023-07-14 DIAGNOSIS — F3181 Bipolar II disorder: Secondary | ICD-10-CM

## 2023-07-14 DIAGNOSIS — F902 Attention-deficit hyperactivity disorder, combined type: Secondary | ICD-10-CM

## 2023-07-14 DIAGNOSIS — F411 Generalized anxiety disorder: Secondary | ICD-10-CM

## 2023-07-14 NOTE — Progress Notes (Unsigned)
THERAPIST PROGRESS NOTE  Session Time: 3:04pm ( 55 minutes)  Participation Level: Active  Behavioral Response: CasualAlert/Dysphoric,Depressed  Type of Therapy: Individual Therapy  Treatment Goals addressed: STG: Jamileth increase management of moods(depressive/manic episodes) AEB development of effective coping skills with ability to reframe maladaptive thinking within the next 6 months    STG: Deyonna will increase management of stressors AEB development of effective decision making skills with ability to identify realistic vs unrealistic stressors within the next 6 months   ProgressTowards Goals: Progressing  Interventions: CBT and Supportive/Solution focused  Summary:   Kelita Bechtel is a 22 y.o. female who presents with dx of bipolar disorder, GAD and ADHD. Lydiana presents alert and oriented. Mood and affect low; tearful at times; attempts to cope and minimize with laughter. Engaged and receptive to interventions.  Shares to for moods to have been stable however notes increase in stressors as of today with lights being turned off. States for brother to have supposed to have paid light bill however failed to do so and shares for him to have purchased game console instead and was more focused on paying internet bill. Shares community resources she has looked into and shares difficulty receiving assistance. Denies natural supports for assistance and shares her father is "doing what he can." Shares need to do and complete school work which she is unable to do at home lacking power. Shares additional stressor of having to pay x 300 dollars in order to enroll in next semesters coursework. Expresses desire to live independently and feels she would better manage her own finances vs. Supporting several others in the home with high cost of bills and utilities. Shares for her car payment to also be a drain on her and that she does not even drive the car often as just to work. Explores desire to buy a  car outright and perhaps turn in car and utilize car share services. Receptive of additional resources provided by therapist. Denies SI/HI. Increase in depressive and anxiety sxs at this time. Ongoing work towards goals.   Suicidal/Homicidal: Nowithout intent/plan  Therapist Response: Therapist engaged Krisandra in therapy session. Check in and assessed for current level of functioning, sxs management and level of stressors. Provided safe space for Mellissa to shares thoughts and feelings in regards to current increase in stressors. Active empathic listening; providing support and encouragement.Explored use of community resources and provided additional resources to follow up on. Supported in processing factors that contributed to inability to pay utility bill and explored role she has played with family. Explored concerns with no longer residing with family and hx of family dynamics. Encouraged to follow up with resources and exploring what she feels is best for her. Reviewed session and provided follow up.   Plan: Return again in  x 5 weeks.  Diagnosis: Bipolar 2 disorder, major depressive episode (HCC)  Generalized anxiety disorder  Attention deficit hyperactivity disorder (ADHD), combined type  Collaboration of Care: Other Community resources for [employer]  Patient/Guardian was advised Release of Information must be obtained prior to any record release in order to collaborate their care with an outside provider. Patient/Guardian was advised if they have not already done so to contact the registration department to sign all necessary forms in order for [place] to release information regarding their care.   Consent: Patient/Guardian gives verbal consent for treatment and assignment of benefits for services provided during this visit. Patient/Guardian expressed understanding and agreed to proceed.   Stephan Minister [place], [facility] 07/14/2023

## 2023-08-13 ENCOUNTER — Encounter (HOSPITAL_BASED_OUTPATIENT_CLINIC_OR_DEPARTMENT_OTHER): Payer: Self-pay | Admitting: Family Medicine

## 2023-09-09 ENCOUNTER — Ambulatory Visit (HOSPITAL_COMMUNITY): Payer: Medicaid Other | Admitting: Mental Health

## 2023-09-10 ENCOUNTER — Other Ambulatory Visit (HOSPITAL_BASED_OUTPATIENT_CLINIC_OR_DEPARTMENT_OTHER): Payer: Medicaid Other

## 2023-09-17 ENCOUNTER — Ambulatory Visit (HOSPITAL_BASED_OUTPATIENT_CLINIC_OR_DEPARTMENT_OTHER): Payer: Medicaid Other | Admitting: Family Medicine

## 2023-09-21 ENCOUNTER — Other Ambulatory Visit (HOSPITAL_BASED_OUTPATIENT_CLINIC_OR_DEPARTMENT_OTHER): Payer: Self-pay

## 2023-09-21 ENCOUNTER — Ambulatory Visit (HOSPITAL_BASED_OUTPATIENT_CLINIC_OR_DEPARTMENT_OTHER): Payer: Medicaid Other | Admitting: Family Medicine

## 2023-09-21 DIAGNOSIS — E559 Vitamin D deficiency, unspecified: Secondary | ICD-10-CM

## 2023-09-22 LAB — VITAMIN D 25 HYDROXY (VIT D DEFICIENCY, FRACTURES): Vit D, 25-Hydroxy: 19.5 ng/mL — ABNORMAL LOW (ref 30.0–100.0)

## 2023-09-24 ENCOUNTER — Encounter (HOSPITAL_BASED_OUTPATIENT_CLINIC_OR_DEPARTMENT_OTHER): Payer: Self-pay | Admitting: Family Medicine

## 2023-09-24 ENCOUNTER — Ambulatory Visit (HOSPITAL_BASED_OUTPATIENT_CLINIC_OR_DEPARTMENT_OTHER): Payer: Medicaid Other | Admitting: Family Medicine

## 2023-09-24 VITALS — BP 132/88 | HR 91 | Ht 68.0 in | Wt >= 6400 oz

## 2023-09-24 DIAGNOSIS — M722 Plantar fascial fibromatosis: Secondary | ICD-10-CM

## 2023-09-24 DIAGNOSIS — Z6841 Body Mass Index (BMI) 40.0 and over, adult: Secondary | ICD-10-CM

## 2023-09-24 DIAGNOSIS — E559 Vitamin D deficiency, unspecified: Secondary | ICD-10-CM | POA: Diagnosis not present

## 2023-09-24 DIAGNOSIS — J4520 Mild intermittent asthma, uncomplicated: Secondary | ICD-10-CM | POA: Diagnosis not present

## 2023-09-24 DIAGNOSIS — D509 Iron deficiency anemia, unspecified: Secondary | ICD-10-CM

## 2023-09-24 DIAGNOSIS — F429 Obsessive-compulsive disorder, unspecified: Secondary | ICD-10-CM | POA: Insufficient documentation

## 2023-09-24 DIAGNOSIS — J3089 Other allergic rhinitis: Secondary | ICD-10-CM

## 2023-09-24 HISTORY — DX: Plantar fascial fibromatosis: M72.2

## 2023-09-24 MED ORDER — SEMAGLUTIDE-WEIGHT MANAGEMENT 0.25 MG/0.5ML ~~LOC~~ SOAJ
0.2500 mg | SUBCUTANEOUS | 0 refills | Status: AC
Start: 2023-09-24 — End: 2023-10-22

## 2023-09-24 MED ORDER — CETIRIZINE HCL 10 MG PO TABS
10.0000 mg | ORAL_TABLET | Freq: Every day | ORAL | 3 refills | Status: AC
Start: 2023-09-24 — End: ?

## 2023-09-24 MED ORDER — FLUTICASONE PROPIONATE 50 MCG/ACT NA SUSP
2.0000 | Freq: Every day | NASAL | 3 refills | Status: AC | PRN
Start: 2023-09-24 — End: ?

## 2023-09-24 MED ORDER — ALBUTEROL SULFATE HFA 108 (90 BASE) MCG/ACT IN AERS
2.0000 | INHALATION_SPRAY | Freq: Four times a day (QID) | RESPIRATORY_TRACT | 5 refills | Status: AC | PRN
Start: 2023-09-24 — End: ?

## 2023-09-24 MED ORDER — SEMAGLUTIDE-WEIGHT MANAGEMENT 0.5 MG/0.5ML ~~LOC~~ SOAJ: 0.5000 mg | SUBCUTANEOUS | 1 refills | Status: DC

## 2023-09-24 MED ORDER — MONTELUKAST SODIUM 10 MG PO TABS
10.0000 mg | ORAL_TABLET | Freq: Every day | ORAL | 3 refills | Status: AC
Start: 2023-09-24 — End: 2024-09-23

## 2023-09-24 MED ORDER — VITAMIN D (ERGOCALCIFEROL) 1.25 MG (50000 UNIT) PO CAPS
50000.0000 [IU] | ORAL_CAPSULE | ORAL | 1 refills | Status: AC
Start: 2023-09-24 — End: ?

## 2023-09-24 NOTE — Progress Notes (Signed)
 Subjective:   Ashley Cantu July 21, 2001 09/24/2023  Chief Complaint  Patient presents with   Medical Management of Chronic Issues    Patient is here following up on vitamin D . Denies any real concerns for today.    HPI: Ashley Cantu presents today for re-assessment and management of chronic medical conditions.  WEIGHT MANAGEMENT: Ashley Cantu presents for weight management.  Adhering to healthy diet: Yes, she has made dietary changes with decrease intake, calorie counting, regular exercise and is meeting with Nutrition on 09/28/22. Pt reports she has made changes in the past year w/ exercise and has seen mild improvement in weight. Would like to discuss medications w/ nutrition counseling and exercise for assistance in weight management. Reports family hx of obesity.   Medications tried in the past: None  Lab Results  Component Value Date   HGBA1C 5.5 03/31/2023   Lab Results  Component Value Date   TSH 2.880 03/05/2022   Wt Readings from Last 3 Encounters:  09/24/23 (!) 502 lb (227.7 kg)  07/06/23 (!) 502 lb (227.7 kg)  03/17/23 (!) 497 lb (225.4 kg)     VITAMIN D  DEFICIENCY: Ashley Cantu presents for the medical management of Vitamin D  deficiency.  Current regimen: Vitamin D  50,000 unit capsule once weekly   Complaint with regimen: Yes Up to date DEXA: N/a per age  Denies recent falls or injury.  Last vitamin D  Lab Results  Component Value Date   VD25OH 19.5 (L) 09/21/2023    IDA:  Ashley Cantu presents for the medical management of Anemia.  Current medication : None; pt was recommended to start OTC Iron  supplement but has not.   Well controlled: Will repeat iron  studies today.  Denies bloody stools, hematuria, excessive fatigue, palpitations, pica.  Patient is not seeing hematology for management.    Lab Results  Component Value Date   WBC 7.4 03/31/2023   HGB 12.3 03/31/2023   HCT 40.5 03/31/2023   MCV 76 (L) 03/31/2023   PLT 281 03/31/2023     Lab Results  Component Value Date   IRON  24 (L) 04/03/2023   TIBC 379 04/03/2023   FERRITIN 8 (L) 04/03/2023   HEEL PAIN:  Reports heel pain bilaterally, worse to right side than left. She states she does a lot of standing and walking frequently. She wears sketchers memory foam work shoes daily. Pain is worse in morning. Denies Shooting pain, numbness, tingling or sensation changes.   The following portions of the patient's history were reviewed and updated as appropriate: past medical history, past surgical history, family history, social history, allergies, medications, and problem list.   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   ADHD (attention deficit hyperactivity disorder) 03/05/2020   Migraine without aura and without status migrainosus, not intractable 12/14/2019   Insulin resistance 11/28/2019   Vitamin D  deficiency 10/19/2019   At risk for side effect of medication 10/19/2019   Body mass index (BMI) greater than 99th percentile for age in childhood 10/17/2019   Disequilibrium syndrome 07/12/2019   Frequent episodic tension-type headache, not intractable 07/12/2019   Migraine variant with headache 07/12/2019   Vertigo 06/27/2019   Hyperuricemia 04/15/2019   Asthma 10/15/2018   Aspergilloma (  HCC) 09/28/2018   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   Bronchiectasis without complication (HCC) 07/02/2018   Acanthosis nigricans 12/07/2017   Intermittent hypertension 12/02/2017   Left genital labial abscess 08/06/2017   Deviated nasal septum 07/07/2017   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   Enlargement of right atrium 04/22/2017    Hepatic steatosis 04/22/2017   Gastroesophageal reflux disease 01/23/2017   Atelectasis 11/07/2016   Chronic non-seasonal allergic rhinitis 11/07/2016   Generalized anxiety disorder 09/10/2016   Obesity with serious comorbidity and body mass index (BMI) greater than 99th percentile for age in pediatric patient 09/10/2016   Binge eating 09/10/2016   Severe obesity due to excess calories with serious comorbidity and body mass index (BMI) greater than 99th percentile for age in pediatric patient (HCC) 09/10/2016   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   Asthma when she was a baby   Bone spur    Bronchiectasis (HCC)    Cholelithiasis 04/01/2017   Cough 10/15/2018   Depression    Environmental allergies    Gallbladder problem    GERD (gastroesophageal reflux disease)    Headache    Heartburn    IBS (irritable bowel syndrome)    Joint pain    Lactose intolerance    Migraine variant    Obesity    PONV (postoperative nausea and vomiting)    with the rhinoplasty surgery   Vitamin D  deficiency    Past Surgical History:  Procedure Laterality Date   CHOLECYSTECTOMY N/A 04/01/2017   Procedure: LAPAROSCOPIC CHOLECYSTECTOMY;  Surgeon: Chuckie Casimiro KIDD, MD;  Location: [facility] OR;  Service: General;  Laterality: N/A;   RHINOPLASTY     VIDEO BRONCHOSCOPY WITH ENDOBRONCHIAL NAVIGATION N/A 07/11/2020   Procedure: VIDEO BRONCHOSCOPY WITH ENDOBRONCHIAL NAVIGATION  WITH INDOCYANINE GREEN /METHYLENE BLUE  MARKING INJECTION OF RIGHT UPPER LOBE MASS;  Surgeon: Army Dallas NOVAK, MD;  Location: [facility] OR;  Service: Thoracic;  Laterality: N/A;   Family History  Problem Relation Age of Onset   Hypertension Mother    Mental illness Mother    Depression Mother    Bipolar disorder Mother    Schizophrenia Mother    Heart disease Mother    Liver disease  Mother    Alcohol abuse Mother    Drug abuse Mother    Obesity Mother    Asthma Mother    Migraines Mother    High blood pressure Father    Depression Father    Sleep apnea Father    Obesity Father    Asthma Father    Migraines Father    COPD Maternal Grandmother    Asthma Maternal Grandmother    Breast cancer Maternal Grandmother    Schizophrenia Maternal Grandfather    Bipolar disorder Maternal Grandfather    Asthma Maternal Grandfather    Seizures Paternal Grandmother    Atrial fibrillation Paternal Grandmother    Asthma Paternal Grandmother    Breast cancer Paternal Grandmother    Diabetes Paternal Grandfather    Congestive Heart Failure Paternal Grandfather    Asthma Paternal Grandfather    Breast cancer Paternal Aunt 47   Outpatient Medications Prior to Visit  Medication Sig Dispense Refill   ARIPiprazole  (ABILIFY ) 5 MG tablet Take 1 tablet (5 mg total) by mouth daily. 30 tablet 3  esomeprazole  (NEXIUM ) 40 MG capsule TAKE 1 CAPSULE(40 MG) BY MOUTH DAILY 90 capsule 1   albuterol  (VENTOLIN  HFA) 108 (90 Base) MCG/ACT inhaler Inhale 2 puffs into the lungs every 6 (six) hours as needed for wheezing or shortness of breath. 18 g 1   cetirizine  (ZYRTEC ) 10 MG tablet TAKE 1 TABLET(10 MG) BY MOUTH DAILY AS NEEDED 90 tablet 1   fluticasone  (FLONASE ) 50 MCG/ACT nasal spray Place 2 sprays into both nostrils daily as needed. 18.2 mL 1   montelukast  (SINGULAIR ) 10 MG tablet Take 1 tablet (10 mg total) by mouth at bedtime. 90 tablet 3   Vitamin D , Ergocalciferol , (DRISDOL ) 1.25 MG (50000 UNIT) CAPS capsule Take 1 capsule (50,000 Units total) by mouth every 7 (seven) days. 12 capsule 1   No facility-administered medications prior to visit.   Allergies  Allergen Reactions   Penicillins Anaphylaxis, Hives and Swelling    Has patient had a PCN reaction causing immediate rash, facial/tongue/throat swelling, SOB or lightheadedness with hypotension: yes Has patient had a PCN reaction  causing severe rash involving mucus membranes or skin necrosis: no Has patient had a PCN reaction that required hospitalization: no Has patient had a PCN reaction occurring within the last 10 years: bo If all of the above answers are NO, then may proceed with Cephalosporin use.      ROS: A complete ROS was performed with pertinent positives/negatives noted in the HPI. The remainder of the ROS are negative.    Objective:   Today's Vitals   09/24/23 0948 09/24/23 1010  BP: (!) 132/107 132/88  Pulse: 91   SpO2: 99%   Weight: (!) 502 lb (227.7 kg)   Height: 5' 8 (1.727 m)     Physical Exam          GENERAL: Well-appearing, in NAD. Morbidly obese.  SKIN: Pink, warm and dry. No rash, lesion, ulceration, or ecchymoses.  Head: Normocephalic. NECK: Trachea midline. Full ROM w/o pain or tenderness.  EYES: Conjunctiva clear without exudates. EOMI, PERRL, no drainage present.  THROAT: Uvula midline. Oropharynx clear. Mucous membranes pink and moist.  RESPIRATORY: Chest wall symmetrical. Respirations even and non-labored. Breath sounds clear to auscultation bilaterally. Mild hypoventilation due to body habitus upon ambulation.  CARDIAC: S1, S2 present, regular rate and rhythm without murmur or gallops. Peripheral pulses 2+ bilaterally.  MSK: Muscle tone and strength appropriate for age.  EXTREMITIES: Without clubbing, cyanosis. Mild +1 non pitting edema to bilateral lower extremities. + point tenderness to right heel with palpation. Full ROM present in BLE.  NEUROLOGIC: No motor or sensory deficits. Steady, even gait. C2-C12 intact.  PSYCH/MENTAL STATUS: Alert, oriented x 3. Cooperative, appropriate mood and affect.   Last vitamin D  Lab Results  Component Value Date   VD25OH 19.5 (L) 09/21/2023       Assessment & Plan:  1. Morbid obesity with BMI of 70 and over, adult (HCC) (Primary) No signs of prediabetes, type 2 diabetes, or thyroid disorder that is contributing to morbid  obesity.  Patient is scheduled with nutrition counseling which a registered dietitian and will start semaglutide  0.25 mg once weekly for 4 weeks.  She will then increase to 0.5 mg once weekly.  We will follow-up in approximately 3 to 4 months for weight loss evaluation.  We discussed the signs, symptoms, possible adverse effects of this medication in depth and patient verbalized understanding.  Discussed needed dietary changes including possible caloric restriction, increased protein intake, and regular exercise.  I believe patient would  have a significant benefit with her weight loss including benefit to asthma, joint stress, and prevention of comorbidities.  - Semaglutide -Weight Management 0.25 MG/0.5ML SOAJ; Inject 0.25 mg into the skin once a week for 28 days.  Dispense: 2 mL; Refill: 0 - Semaglutide -Weight Management 0.5 MG/0.5ML SOAJ; Inject 0.5 mg into the skin once a week.  Dispense: 2 mL; Refill: 1 - Comprehensive metabolic panel  2. Vitamin D  deficiency Slight improvement, will continue on 50,000 unit capsules once weekly.  Recheck this level in 6 months.  Recommend patient start taking probiotic to help with possible impaired absorption and also take this with calcium . - Vitamin D , Ergocalciferol , (DRISDOL ) 1.25 MG (50000 UNIT) CAPS capsule; Take 1 capsule (50,000 Units total) by mouth every 7 (seven) days.  Dispense: 12 capsule; Refill: 1  3. Microcytic anemia Will obtain iron  and CBC today to evaluate status of anemia.  If needed, will recommend OTC iron  supplementation. - Iron , TIBC and Ferritin Panel - CBC with Differential  4. Intermittent asthma without complication, unspecified asthma severity Obesity related hypoventilation likely contributing to intermittent asthma.  Will continue with albuterol  inhaler as needed as patient reports it is well-controlled currently. - albuterol  (VENTOLIN  HFA) 108 (90 Base) MCG/ACT inhaler; Inhale 2 puffs into the lungs every 6 (six) hours as needed  for wheezing or shortness of breath.  Dispense: 18 g; Refill: 5  5. Environmental and seasonal allergies Well-controlled, continue current regimen refills supplied. - cetirizine  (ZYRTEC ) 10 MG tablet; Take 1 tablet (10 mg total) by mouth daily.  Dispense: 90 tablet; Refill: 3 - fluticasone  (FLONASE ) 50 MCG/ACT nasal spray; Place 2 sprays into both nostrils daily as needed.  Dispense: 18.2 mL; Refill: 3 - montelukast  (SINGULAIR ) 10 MG tablet; Take 1 tablet (10 mg total) by mouth at bedtime.  Dispense: 90 tablet; Refill: 3  6. Plantar fasciitis of right foot Discussed change to proper footwear with arch support, ice heel down frequently, perform stretches provided by PCP and also use ibuprofen  as needed for inflammation.  If no improvement in 1 to 2 weeks, reach out to PCP and may need steroid injection by podiatry.   Meds ordered this encounter  Medications   albuterol  (VENTOLIN  HFA) 108 (90 Base) MCG/ACT inhaler    Sig: Inhale 2 puffs into the lungs every 6 (six) hours as needed for wheezing or shortness of breath.    Dispense:  18 g    Refill:  5    Supervising Provider:   DE CUBA, RAYMOND J [[id number]]   cetirizine  (ZYRTEC ) 10 MG tablet    Sig: Take 1 tablet (10 mg total) by mouth daily.    Dispense:  90 tablet    Refill:  3    Supervising Provider:   DE CUBA, RAYMOND J [[id number]]   fluticasone  (FLONASE ) 50 MCG/ACT nasal spray    Sig: Place 2 sprays into both nostrils daily as needed.    Dispense:  18.2 mL    Refill:  3    Supervising Provider:   DE CUBA, RAYMOND J [[id number]]   montelukast  (SINGULAIR ) 10 MG tablet    Sig: Take 1 tablet (10 mg total) by mouth at bedtime.    Dispense:  90 tablet    Refill:  3    Supervising Provider:   DE CUBA, RAYMOND J [[id number]]   Vitamin D , Ergocalciferol , (DRISDOL ) 1.25 MG (50000 UNIT) CAPS capsule    Sig: Take 1 capsule (50,000 Units total) by mouth every 7 (seven) days.    Dispense:  12 capsule    Refill:  1    Supervising Provider:   DE CUBA,  RAYMOND J [[id number]]   Semaglutide -Weight Management 0.25 MG/0.5ML SOAJ    Sig: Inject 0.25 mg into the skin once a week for 28 days.    Dispense:  2 mL    Refill:  0    Supervising Provider:   DE CUBA, RAYMOND J [[id number]]   Semaglutide -Weight Management 0.5 MG/0.5ML SOAJ    Sig: Inject 0.5 mg into the skin once a week.    Dispense:  2 mL    Refill:  1    Supervising Provider:   DE CUBA, RAYMOND J [[id number]]   Lab Orders         Comprehensive metabolic panel         Iron , TIBC and Ferritin Panel         CBC with Differential      Return in about 4 months (around 01/22/2024) for Follow up Weight Management, Anemia (labs at visit).    Patient to reach out to office if new, worrisome, or unresolved symptoms arise or if no improvement in patient's condition. Patient verbalized understanding and is agreeable to treatment plan. All questions answered to patient's satisfaction.    Ashley Cantu, [place]

## 2023-09-24 NOTE — Patient Instructions (Signed)
 Probiotics:  Culturelle Align  Great Value Probiotic for Women    Use Ibuprofen  600-800mg  three times daily with food for the next 3-5 days for heel pain. Ice your heel for 20 minutes in the AM and PM. Obtain arch support (Dr. Farris, etc). Perform stretches. If no improvement in 1-2 weeks, please reach out.

## 2023-09-25 ENCOUNTER — Encounter (HOSPITAL_BASED_OUTPATIENT_CLINIC_OR_DEPARTMENT_OTHER): Payer: Self-pay | Admitting: Family Medicine

## 2023-09-25 LAB — CBC WITH DIFFERENTIAL/PLATELET
Basophils Absolute: 0 10*3/uL (ref 0.0–0.2)
Basos: 0 %
EOS (ABSOLUTE): 0.1 10*3/uL (ref 0.0–0.4)
Eos: 1 %
Hematocrit: 41.9 % (ref 34.0–46.6)
Hemoglobin: 12.3 g/dL (ref 11.1–15.9)
Immature Grans (Abs): 0 10*3/uL (ref 0.0–0.1)
Immature Granulocytes: 0 %
Lymphocytes Absolute: 2.4 10*3/uL (ref 0.7–3.1)
Lymphs: 24 %
MCH: 22.8 pg — ABNORMAL LOW (ref 26.6–33.0)
MCHC: 29.4 g/dL — ABNORMAL LOW (ref 31.5–35.7)
MCV: 78 fL — ABNORMAL LOW (ref 79–97)
Monocytes Absolute: 0.6 10*3/uL (ref 0.1–0.9)
Monocytes: 6 %
Neutrophils Absolute: 6.9 10*3/uL (ref 1.4–7.0)
Neutrophils: 69 %
Platelets: 299 10*3/uL (ref 150–450)
RBC: 5.39 x10E6/uL — ABNORMAL HIGH (ref 3.77–5.28)
RDW: 14.3 % (ref 11.7–15.4)
WBC: 9.9 10*3/uL (ref 3.4–10.8)

## 2023-09-25 LAB — COMPREHENSIVE METABOLIC PANEL
ALT: 13 [IU]/L (ref 0–32)
AST: 16 [IU]/L (ref 0–40)
Albumin: 4.3 g/dL (ref 4.0–5.0)
Alkaline Phosphatase: 102 [IU]/L (ref 44–121)
BUN/Creatinine Ratio: 18 (ref 9–23)
BUN: 12 mg/dL (ref 6–20)
Bilirubin Total: 0.2 mg/dL (ref 0.0–1.2)
CO2: 22 mmol/L (ref 20–29)
Calcium: 9 mg/dL (ref 8.7–10.2)
Chloride: 104 mmol/L (ref 96–106)
Creatinine, Ser: 0.67 mg/dL (ref 0.57–1.00)
Globulin, Total: 2.7 g/dL (ref 1.5–4.5)
Glucose: 103 mg/dL — ABNORMAL HIGH (ref 70–99)
Potassium: 5.2 mmol/L (ref 3.5–5.2)
Sodium: 138 mmol/L (ref 134–144)
Total Protein: 7 g/dL (ref 6.0–8.5)
eGFR: 127 mL/min/{1.73_m2} (ref >59)

## 2023-09-25 LAB — IRON,TIBC AND FERRITIN PANEL
Ferritin: 13 ng/mL — ABNORMAL LOW (ref 15–150)
Iron Saturation: 10 % — ABNORMAL LOW (ref 15–55)
Iron: 42 ug/dL (ref 27–159)
Total Iron Binding Capacity: 404 ug/dL (ref 250–450)
UIBC: 362 ug/dL (ref 131–425)

## 2023-09-28 ENCOUNTER — Encounter (HOSPITAL_COMMUNITY): Payer: Self-pay | Admitting: Psychiatry

## 2023-09-28 ENCOUNTER — Ambulatory Visit (INDEPENDENT_AMBULATORY_CARE_PROVIDER_SITE_OTHER): Payer: Medicaid Other | Admitting: Psychiatry

## 2023-09-28 DIAGNOSIS — F3181 Bipolar II disorder: Secondary | ICD-10-CM

## 2023-09-28 MED ORDER — ARIPIPRAZOLE 5 MG PO TABS
5.0000 mg | ORAL_TABLET | Freq: Every day | ORAL | 3 refills | Status: DC
Start: 2023-09-28 — End: 2023-12-03

## 2023-09-28 NOTE — Progress Notes (Signed)
 Hi Ashley Cantu, Your kidney, liver function, and electrolytes are normal.  You can go ahead and start the Wegovy .  Your blood count still did show mild anemia present.  Your iron  has improved from approximately 5 months ago though.  Are you currently taking any iron  supplementation?  I would recommend a multivitamin with iron  or a over-the-counter iron  supplement such as Slow Fe taken approximately twice a week to help.  We can recheck these labs at your appointment in May.  If you have any questions please let me know

## 2023-09-28 NOTE — Progress Notes (Signed)
 [facility] MD/PA/NP OP Progress Note      09/28/2023 1:55 PM Ashley Cantu  MRN:  [id number]  Chief Complaint: I have been visit   HPI:22 y/o female seen today for follow up psychiatric evaluation. She has a psychiatric history of GAD, ADHD, Bipolar II disorder and OCD. Patient is currently prescribed Abilify  5 mg daily. She reports that Abilify  is are effective in managing her psychiatric conditions.  Today she was well-groomed, pleasant, cooperative, and engaged in conversation.  She informed clinical research associate that she has been busy.  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  Patient is also enrolled in school.  Recently she notes that she has joined a gym has began her weight loss journey.  Patient reports that her goal is to lose 100 pounds 6 months.  Her PCP recently started her on semaglutide  weight loss medication.    Since her last visit patient informed writer that her anxiety and depression continues to be well-managed.  Today provider conducted a GAD-7 and patient scored a 3, at her last visit she scored a 5.  Provider also conducted PHQ-9 patient 6, at her last visit she scored a 5.  She endorses adequate sleep and appetite.  Today she denies SI/HI/VAH, mania or paranoia.   No medication changes made today. She will continue Abilify  as prescribed. No other concerns at this time.   Visit Diagnosis:    ICD-10-CM   1. 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   Asthma when she was a baby   Bone spur     Bronchiectasis (HCC)    Cholelithiasis 04/01/2017   Cough 10/15/2018   Depression    Environmental allergies    Gallbladder problem    GERD (gastroesophageal reflux disease)    Headache    Heartburn    IBS (irritable bowel syndrome)    Joint pain    Lactose intolerance    Migraine variant    Obesity    PONV (postoperative nausea and vomiting)    with the rhinoplasty surgery   Vitamin D  deficiency     Past Surgical History:  Procedure Laterality Date   CHOLECYSTECTOMY N/A 04/01/2017   Procedure: LAPAROSCOPIC CHOLECYSTECTOMY;  Surgeon: Chuckie Casimiro KIDD, MD;  Location: [facility] OR;  Service: General;  Laterality: N/A;   RHINOPLASTY     VIDEO BRONCHOSCOPY WITH ENDOBRONCHIAL NAVIGATION N/A 07/11/2020   Procedure: VIDEO BRONCHOSCOPY WITH ENDOBRONCHIAL NAVIGATION  WITH INDOCYANINE GREEN /METHYLENE BLUE  MARKING INJECTION OF RIGHT UPPER LOBE MASS;  Surgeon: Army Dallas NOVAK, MD;  Location: [facility] OR;  Service: Thoracic;  Laterality: N/A;    Family Psychiatric History: Mother Schizoaffective Bipolar type  Family History:  Family History  Problem Relation Age of Onset   Hypertension Mother    Mental illness Mother    Depression Mother    Bipolar disorder Mother    Schizophrenia Mother    Heart disease Mother    Liver disease Mother  Alcohol abuse Mother    Drug abuse Mother    Obesity Mother    Asthma Mother    Migraines Mother    High blood pressure Father    Depression Father    Sleep apnea Father    Obesity Father    Asthma Father    Migraines Father    COPD Maternal Grandmother    Asthma Maternal Grandmother    Breast cancer Maternal Grandmother    Schizophrenia Maternal Grandfather    Bipolar disorder Maternal Grandfather    Asthma Maternal Grandfather    Seizures Paternal Grandmother    Atrial fibrillation Paternal Grandmother    Asthma Paternal Grandmother    Breast cancer Paternal Grandmother    Diabetes Paternal Grandfather    Congestive Heart Failure Paternal  Grandfather    Asthma Paternal Grandfather    Breast cancer Paternal Aunt 34    Social History:  Social History   Socioeconomic History   Marital status: Single    Spouse name: Not on file   Number of children: Not on file   Years of education: Not on file   Highest education level: Not on file  Occupational History   Occupation: conservation officer, nature  Tobacco Use   Smoking status: Never    Passive exposure: Yes   Smokeless tobacco: Never  Vaping Use   Vaping status: Never Used  Substance and Sexual Activity   Alcohol use: No   Drug use: No   Sexual activity: Never    Birth control/protection: Abstinence  Other Topics Concern   Not on file  Social History Narrative   Kenitha received her GED.   She attended [employer].   She lives with her dad only.   She has a younger brother.   She is ambidextrous.   1 8oz cup caffeine daily   Social Drivers of Corporate Investment Banker Strain: High Risk (03/17/2023)   Overall Financial Resource Strain (CARDIA)    Difficulty of Paying Living Expenses: Hard  Food Insecurity: No Food Insecurity (03/17/2023)   Hunger Vital Sign    Worried About Running Out of Food in the Last Year: Never true    Ran Out of Food in the Last Year: Never true  Transportation Needs: No Transportation Needs (03/17/2023)   PRAPARE - Administrator, Civil Service (Medical): No    Lack of Transportation (Non-Medical): No  Physical Activity: Sufficiently Active (03/17/2023)   Exercise Vital Sign    Days of Exercise per Week: 4 days    Minutes of Exercise per Session: 40 min  Stress: No Stress Concern Present (03/17/2023)   [employer] of Occupational Health - Occupational Stress Questionnaire    Feeling of Stress : Only a little  Social Connections: Socially Isolated (03/17/2023)   Social Connection and Isolation Panel [NHANES]    Frequency of Communication with Friends and Family: More than three times a week    Frequency of Social Gatherings with Friends  and Family: Twice a week    Attends Religious Services: Never    Database Administrator or Organizations: No    Attends Banker Meetings: Never    Marital Status: Never married    Allergies:  Allergies  Allergen Reactions   Penicillins Anaphylaxis, Hives and Swelling    Has patient had a PCN reaction causing immediate rash, facial/tongue/throat swelling, SOB or lightheadedness with hypotension: yes Has patient had a PCN reaction causing severe rash involving mucus membranes or skin necrosis: no Has patient  had a PCN reaction that required hospitalization: no Has patient had a PCN reaction occurring within the last 10 years: bo If all of the above answers are NO, then may proceed with Cephalosporin use.     Metabolic Disorder Labs: Lab Results  Component Value Date   HGBA1C 5.5 03/31/2023   No results found for: PROLACTIN Lab Results  Component Value Date   CHOL 146 03/31/2023   TRIG 127 03/31/2023   HDL 45 03/31/2023   CHOLHDL 3.2 03/31/2023   LDLCALC 78 03/31/2023   LDLCALC 79 03/05/2022   Lab Results  Component Value Date   TSH 2.880 03/05/2022   TSH 1.410 04/24/2020    Therapeutic Level Labs: No results found for: LITHIUM No results found for: VALPROATE No results found for: CBMZ  Current Medications: Current Outpatient Medications  Medication Sig Dispense Refill   albuterol  (VENTOLIN  HFA) 108 (90 Base) MCG/ACT inhaler Inhale 2 puffs into the lungs every 6 (six) hours as needed for wheezing or shortness of breath. 18 g 5   ARIPiprazole  (ABILIFY ) 5 MG tablet Take 1 tablet (5 mg total) by mouth daily. 30 tablet 3   cetirizine  (ZYRTEC ) 10 MG tablet Take 1 tablet (10 mg total) by mouth daily. 90 tablet 3   esomeprazole  (NEXIUM ) 40 MG capsule TAKE 1 CAPSULE(40 MG) BY MOUTH DAILY 90 capsule 1   fluticasone  (FLONASE ) 50 MCG/ACT nasal spray Place 2 sprays into both nostrils daily as needed. 18.2 mL 3   montelukast  (SINGULAIR ) 10 MG tablet Take 1  tablet (10 mg total) by mouth at bedtime. 90 tablet 3   Semaglutide -Weight Management 0.25 MG/0.5ML SOAJ Inject 0.25 mg into the skin once a week for 28 days. 2 mL 0   [START ON 10/21/2023] Semaglutide -Weight Management 0.5 MG/0.5ML SOAJ Inject 0.5 mg into the skin once a week. 2 mL 1   Vitamin D , Ergocalciferol , (DRISDOL ) 1.25 MG (50000 UNIT) CAPS capsule Take 1 capsule (50,000 Units total) by mouth every 7 (seven) days. 12 capsule 1   No current facility-administered medications for this visit.     Musculoskeletal: Strength & Muscle Tone: within normal limits and Telehealth visit Gait & Station: normal, Telehealth visit Patient leans: N/A  Psychiatric Specialty Exam: Review of Systems  Last menstrual period 09/09/2023.There is no height or weight on file to calculate BMI.  General Appearance: Well Groomed  Eye Contact:  Good  Speech:  Clear and Coherent and Normal Rate  Volume:  Normal  Mood:  Euthymic  Affect:  Appropriate and Congruent  Thought Process:  Coherent, Goal Directed and Linear  Orientation:  Full (Time, Place, and Person)  Thought Content: WDL and Logical   Suicidal Thoughts:  No  Homicidal Thoughts:  No  Memory:  Immediate;   Good Recent;   Good Remote;   Good  Judgement:  Good  Insight:  Good  Psychomotor Activity:  Normal  Concentration:  Concentration: Good and Attention Span: Good  Recall:  Good  Fund of Knowledge: Good  Language: Good  Akathisia:  No  Handed:  Right  AIMS (if indicated):  not done  Assets:  Communication Skills Desire for Improvement Financial Resources/Insurance Housing Social Support  ADL's:  Intact  Cognition: WNL  Sleep:  Good   Screenings: AIMS    Flowsheet Row Clinical Support from 04/09/2023 in [facility]  AIMS Total Score 0      GAD-7    Flowsheet Row Clinical Support from 09/28/2023 in [facility] Office  Visit from 09/24/2023 in [facility] at [facility] Video Visit from 07/09/2023 in [facility] Clinical Support from 04/09/2023 in [facility] Office Visit from 03/17/2023 in [facility] Primary [facility] & [facility] Medicine at [facility]  Total GAD-7 Score 3 6 5 6 5       PHQ2-9    Flowsheet Row Clinical Support from 09/28/2023 in [facility] Office Visit from 09/24/2023 in [facility] & [facility] Medicine at [facility] Video Visit from 07/09/2023 in [facility] Office Visit from 07/06/2023 in [facility] at [facility] Clinical Support from 04/09/2023 in [facility]  PHQ-2 Total Score 2 2 1  0 2  PHQ-9 Total Score 6 9 5  -- 6      Flowsheet Row Counselor from 08/19/2022 in [facility] Clinical Support from 07/29/2022 in [facility] Video Visit from 03/12/2021 in [facility]  C-SSRS RISK CATEGORY No Risk Error: Q7 should not be populated when Q6 is No No Risk        Assessment and Plan: Patient reports that she is doing well on her current medication regimen.  She reports that she is busy with work and school.  She also has started a weight loss journey.  No medication changes made today. Patient agreeable to continue Abilify  as prescribed.   1. Bipolar 2 disorder, major depressive episode (HCC)  Continue- ARIPiprazole  (ABILIFY ) 5 MG tablet; Take 1 tablet (5 mg total) by mouth daily.  Dispense: 30 tablet; Refill: 3        Follow-up in 2 month Follow-up with therapy  Zane FORBES Bach, NP 09/28/2023, 1:55 PM

## 2023-09-29 ENCOUNTER — Encounter: Payer: Medicaid Other | Attending: Psychiatry | Admitting: Dietician

## 2023-09-29 ENCOUNTER — Encounter: Payer: Self-pay | Admitting: Dietician

## 2023-09-29 NOTE — Progress Notes (Signed)
 Medical Nutrition Therapy  Appointment Start time:  1400  Appointment End time:  1458  Primary concerns today: no concerns   Referral diagnosis: E66.01 Preferred learning style: no preference indicated Learning readiness: ready   NUTRITION ASSESSMENT   Anthropometrics   Pt declined weight today.   Clinical Medical Hx: anxiety, asthma, depression, GERD, vitamin D  deficiency, bipolar disorder (pt report)  Medications: semaglutide  (hasn't started) Labs: 09/24/23: ferritin 13, iron  saturation 10. 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 Pt works around 4pm-1am most days and gets home around 1:30am.   Pt states she eats more fruit than vegetables. Pt reports she feels that she doesn't eat many vegetables. Pt states she is trying to try more foods. Pt reports she lives with her dad and her dad cooks. Pt states she feels she eats out more often than eating from home due to her busy schedule.   Pt states she was doing some meal and snack planning last year and enjoyed the convenience of cooking ahead of time and having a meal to grab and go, but fell out of the habit.   Pt reports she just started a membership at [employer] and plans to start going.   Pt states she grew up in a household in which she had to clean her plate before getting up from the table. Pt reports she feels this impacted her eating habits in adulthood. Pt reports that sometimes she feels she binge eats. Pt states sometimes there is restriction involved, which she feels can cause the binge to occur. Pt reports she also feels it could be emotional eating and notices a pattern with binging when she is feeling more manic.   Pt states she is planning on starting semaglutide  soon.   Estimated daily fluid intake: 48  oz Supplements: vitamin D  Sleep: 5-9 hours, depending on schedule. Sleeps 2am-7am or 10am.  Stress / self-care: moderate-to-high stress Current average weekly physical activity: ADLs  24-Hr Dietary Recall First Meal: none Snack: none Second Meal: 3pm: home: sub and salad OR out: taco bell or cookout or mcdonalds Snack: popcorn at work higher education careers adviser) Third Meal: 1am: taco bell or cookout Snack: none Beverages: 4 cans sparkling water, occasional soda, 32-48 oz water, sweet tea   NUTRITION DIAGNOSIS  NB-1.1 Food and nutrition-related knowledge deficit As related to lack of prior nutrition education by a nutrition professional.  As evidenced by pt report.   NUTRITION INTERVENTION  Nutrition education (E-1) on the following topics:   Plate Method & MyPlate Education Fruits & Vegetables: Aim to fill half your plate with a variety of fruits and vegetables. They are rich in vitamins, minerals, and fiber, and can help reduce the risk of chronic diseases. Choose a colorful assortment of fruits and vegetables to ensure you get a wide range of nutrients. Grains and Starches: Make at least half of your grain choices whole grains, such as brown rice, whole wheat bread, and oats. Whole grains provide fiber, which aids in digestion and healthy cholesterol levels. Aim for whole forms of starchy vegetables such as potatoes, sweet potatoes, beans, peas, and corn, which are fiber rich and provide many vitamins and minerals.  Protein: Incorporate lean sources of protein, such as poultry, fish, beans, nuts, and seeds, into your meals. Protein is essential  for building and repairing tissues, staying full, balancing blood sugar, as well as supporting immune function. Dairy: Include low-fat or fat-free dairy products like milk, yogurt, and cheese in your diet. Dairy foods are excellent sources of calcium  and vitamin D , which are crucial for bone health.  At meals, aim to incorporate 1/4 plate protein, 1/4 plate  complex carbs, and 1/2 plate non-starchy vegetables  Joyful Movement Finding an exercise you enjoy is crucial for maintaining long-term fitness and overall health. Enjoyable activities are more likely to become regular habits, making it easier to stay consistent with physical activity. When you look forward to your workouts, exercise becomes a positive experience rather than a chore, reducing the likelihood of burnout or quitting. Enjoyable exercise also enhances mental well-being, as engaging in activities you love can boost mood, reduce stress, and provide a sense of accomplishment. Aim for 150 minutes of physical activity weekly.    Handouts Provided Include  Plate Method Balanced Snacks  Learning Style & Readiness for Change Teaching method utilized: Visual & Auditory  Demonstrated degree of understanding via: Teach Back  Barriers to learning/adherence to lifestyle change: none  Goals Established by Pt  Goal: eat 3 times per day (can be 2 meals and a balanced snack).   Goal: At least one meal per day, aim to include 1/2 plate non-starchy vegetables, 1/4 plate protein, and 1/4 plate complex carbs. (Plate Method).   Goal: go to the gym 2 times per week for 30-45 minutes.     MONITORING & EVALUATION Dietary intake, weekly physical activity, and follow up in 2 months with Donetta Floyd, MS, RD, LDN.  Next Steps  Patient is to call for questions.

## 2023-09-29 NOTE — Patient Instructions (Signed)
 Goal: eat 3 times per day (can be 2 meals and a balanced snack).   Goal: At least one meal per day, aim to include 1/2 plate non-starchy vegetables, 1/4 plate protein, and 1/4 plate complex carbs. (Plate Method).   Goal: go to the gym 2 times per week for 30-45 minutes.

## 2023-10-08 ENCOUNTER — Telehealth (HOSPITAL_COMMUNITY): Payer: Medicaid Other | Admitting: Psychiatry

## 2023-10-29 ENCOUNTER — Ambulatory Visit (INDEPENDENT_AMBULATORY_CARE_PROVIDER_SITE_OTHER): Payer: Medicaid Other | Admitting: Mental Health

## 2023-10-29 DIAGNOSIS — F411 Generalized anxiety disorder: Secondary | ICD-10-CM

## 2023-10-29 DIAGNOSIS — F3181 Bipolar II disorder: Secondary | ICD-10-CM | POA: Diagnosis not present

## 2023-10-29 NOTE — Progress Notes (Signed)
   THERAPIST PROGRESS NOTE  Session Time: 3:04 pm ( 47 minutes)  Participation Level: Active  Behavioral Response: CasualAlertEuthymic  Type of Therapy: Individual Therapy  Treatment Goals addressed: STG: Khailee increase management of moods(depressive/manic episodes) AEB development of effective coping skills with ability to reframe maladaptive thinking within the next 6 months    STG: Banesa will increase management of stressors AEB development of effective decision making skills with ability to identify realistic vs unrealistic stressors within the next 6 months   ProgressTowards Goals: Progressing  Interventions: Supportive  Summary: Maylyn Narvaiz is a 23 y.o. female who presents with dx of bipolar disorder, GAD and ADHD. Rosalee presents alert and oriented. Mood and affect stable, bright. Engaged and receptive to interventions.  Shares to for moods to have been stable and denies excessive highs or lows. 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 Notes to have kicked brother out as a result of incident of him lacking to pay needed bills, shares for father to have applied for disability and for finances to to be stable at this time with increase hours and increase in pay. Shares increase in work life balance with increase in social life and shares has been abel to engage in community and do something social with friendships approximately x 1 weekly. Notes not currently being in school but plans to return this fall. Notes working to navigate current stressors and working on insurance underwriter. Ongoing work towards goals. Denies SI HI  Suicidal/Homicidal: Nowithout intent/plan  Therapist Response: Therapist engaged Quintana in therapy session. Check in and assessed for current level of functioning, sxs management and level of stressors. Provided safe space for Mellissa to shares  thoughts and feelings in regards to current level of functioning and factors that are contributing to mental health stability. Reviewed working to set appropriate boundaries as needed. Explored current time management techniques and explored skills to support in increased time management. Encouraged ongoing attendance to school and attending to work life balance. Reviewed session and provided follow up  Plan: Return again in  x 5 weeks.  Diagnosis: Bipolar 2 disorder, major depressive episode (HCC)  Generalized anxiety disorder  Collaboration of Care: Other None  Patient/Guardian was advised Release of Information must be obtained prior to any record release in order to collaborate their care with an outside provider. Patient/Guardian was advised if they have not already done so to contact the registration department to sign all necessary forms in order for us  to release information regarding their care.   Consent: Patient/Guardian gives verbal consent for treatment and assignment of benefits for services provided during this visit. Patient/Guardian expressed understanding and agreed to proceed.   Ty Asal [place], [facility] 11/01/2023

## 2023-11-03 ENCOUNTER — Ambulatory Visit: Payer: Medicaid Other | Admitting: Registered"

## 2023-12-01 ENCOUNTER — Encounter (HOSPITAL_COMMUNITY): Payer: Medicaid Other | Admitting: Psychiatry

## 2023-12-03 ENCOUNTER — Encounter (HOSPITAL_COMMUNITY): Payer: Self-pay | Admitting: Psychiatry

## 2023-12-03 ENCOUNTER — Ambulatory Visit (INDEPENDENT_AMBULATORY_CARE_PROVIDER_SITE_OTHER): Payer: Medicaid Other | Admitting: Psychiatry

## 2023-12-03 DIAGNOSIS — F3181 Bipolar II disorder: Secondary | ICD-10-CM | POA: Diagnosis not present

## 2023-12-03 MED ORDER — ARIPIPRAZOLE 5 MG PO TABS
5.0000 mg | ORAL_TABLET | Freq: Every day | ORAL | 3 refills | Status: DC
Start: 2023-12-03 — End: 2024-02-11

## 2023-12-03 NOTE — Progress Notes (Signed)
 [facility] MD/PA/NP OP Progress Note      12/03/2023 12:06 PM Ashley Cantu  MRN:  [id number]  Chief Complaint: "I feel like I'm stable"   HPI:23 y/o female seen today for follow up psychiatric evaluation. She has a psychiatric history of GAD, ADHD, Bipolar II disorder and OCD. Patient is currently prescribed Abilify 5 mg daily. She reports that Abilify is are effective in managing her psychiatric conditions.  Today she was well-groomed, pleasant, cooperative, and engaged in conversation.  She informed Clinical research associate that she feels that she is stable. She notes that she stays busy working. She informed Clinical research associate that she needed to cut her appointment short today as she needed to go to work. She notes that she has taken time off from school. Patient requested not to do an GAD 7 or PHQ 9. Theses assessments can be done at her next visit. She does notes that her anxiety and depression are well managed. She endorses adequate sleep and appetite. Patient notes that she has lost 12 ponds since her last visit due to stress and minimal diet change. She does note that she is eating less fast food.  She notes that her PCP wants her to lose some weight prior to starting semaglutide. Patient blood pressure elevated today at 140/108 (right arm) and 138/32 (left arm). Provider discussed talking to her PCP about her elevated blood pressure. She endorsed understanding and agreed.   No medication changes made today. She will continue Abilify as prescribed. No other concerns at this time.   Visit Diagnosis:    ICD-10-CM   1. 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   Asthma when  she was a baby   Bone spur    Bronchiectasis (HCC)    Cholelithiasis 04/01/2017   Cough 10/15/2018   Depression    Environmental allergies    Gallbladder problem    GERD (gastroesophageal reflux disease)    Headache    Heartburn    IBS (irritable bowel syndrome)    Joint pain    Lactose intolerance    Migraine variant    Obesity    PONV (postoperative nausea and vomiting)    with the rhinoplasty surgery   Vitamin D deficiency     Past Surgical History:  Procedure Laterality Date   CHOLECYSTECTOMY N/A 04/01/2017   Procedure: LAPAROSCOPIC CHOLECYSTECTOMY;  Surgeon: Kandice Hams, MD;  Location: [facility] OR;  Service: General;  Laterality: N/A;   RHINOPLASTY     VIDEO BRONCHOSCOPY WITH ENDOBRONCHIAL NAVIGATION N/A 07/11/2020   Procedure: VIDEO BRONCHOSCOPY WITH ENDOBRONCHIAL NAVIGATION  WITH INDOCYANINE GREEN/METHYLENE BLUE MARKING INJECTION OF RIGHT UPPER LOBE MASS;  Surgeon: Delight Ovens, MD;  Location: [facility] OR;  Service: Thoracic;  Laterality: N/A;    Family Psychiatric History: Mother Schizoaffective Bipolar type  Family History:  Family History  Problem Relation Age of Onset   Hypertension Mother    Mental illness Mother    Depression Mother    Bipolar disorder Mother    Schizophrenia Mother    Heart disease Mother    Liver disease Mother  Alcohol abuse Mother    Drug abuse Mother    Obesity Mother    Asthma Mother    Migraines Mother    High blood pressure Father    Depression Father    Sleep apnea Father    Obesity Father    Asthma Father    Migraines Father    COPD Maternal Grandmother    Asthma Maternal Grandmother    Breast cancer Maternal Grandmother    Schizophrenia Maternal Grandfather    Bipolar disorder Maternal Grandfather    Asthma Maternal Grandfather    Seizures Paternal Grandmother    Atrial fibrillation Paternal Grandmother    Asthma Paternal Grandmother    Breast cancer Paternal Grandmother    Diabetes Paternal Grandfather    Congestive  Heart Failure Paternal Grandfather    Asthma Paternal Grandfather    Breast cancer Paternal Aunt 12    Social History:  Social History   Socioeconomic History   Marital status: Single    Spouse name: Not on file   Number of children: Not on file   Years of education: Not on file   Highest education level: Not on file  Occupational History   Occupation: Conservation officer, nature  Tobacco Use   Smoking status: Never    Passive exposure: Yes   Smokeless tobacco: Never  Vaping Use   Vaping status: Never Used  Substance and Sexual Activity   Alcohol use: No   Drug use: No   Sexual activity: Never    Birth control/protection: Abstinence  Other Topics Concern   Not on file  Social History Narrative   Rashada received her GED.   She attended [employer].   She lives with her dad only.   She has a younger brother.   She is ambidextrous.   1 8oz cup caffeine daily   Social Drivers of Health   Financial Resource Strain: High Risk (03/17/2023)   Overall Financial Resource Strain (CARDIA)    Difficulty of Paying Living Expenses: Hard  Food Insecurity: Food Insecurity Present (09/29/2023)   Hunger Vital Sign    Worried About Running Out of Food in the Last Year: Sometimes true    Ran Out of Food in the Last Year: Never true  Transportation Needs: No Transportation Needs (03/17/2023)   PRAPARE - Administrator, Civil Service (Medical): No    Lack of Transportation (Non-Medical): No  Physical Activity: Sufficiently Active (03/17/2023)   Exercise Vital Sign    Days of Exercise per Week: 4 days    Minutes of Exercise per Session: 40 min  Stress: No Stress Concern Present (03/17/2023)   [employer] of Occupational Health - Occupational Stress Questionnaire    Feeling of Stress : Only a little  Social Connections: Socially Isolated (03/17/2023)   Social Connection and Isolation Panel [NHANES]    Frequency of Communication with Friends and Family: More than three times a week    Frequency of  Social Gatherings with Friends and Family: Twice a week    Attends Religious Services: Never    Database administrator or Organizations: No    Attends Banker Meetings: Never    Marital Status: Never married    Allergies:  Allergies  Allergen Reactions   Penicillins Anaphylaxis, Hives and Swelling    Has patient had a PCN reaction causing immediate rash, facial/tongue/throat swelling, SOB or lightheadedness with hypotension: yes Has patient had a PCN reaction causing severe rash involving mucus membranes or skin necrosis: no Has patient  had a PCN reaction that required hospitalization: no Has patient had a PCN reaction occurring within the last 10 years: bo If all of the above answers are "NO", then may proceed with Cephalosporin use.     Metabolic Disorder Labs: Lab Results  Component Value Date   HGBA1C 5.5 03/31/2023   No results found for: "PROLACTIN" Lab Results  Component Value Date   CHOL 146 03/31/2023   TRIG 127 03/31/2023   HDL 45 03/31/2023   CHOLHDL 3.2 03/31/2023   LDLCALC 78 03/31/2023   LDLCALC 79 03/05/2022   Lab Results  Component Value Date   TSH 2.880 03/05/2022   TSH 1.410 04/24/2020    Therapeutic Level Labs: No results found for: "LITHIUM" No results found for: "VALPROATE" No results found for: "CBMZ"  Current Medications: Current Outpatient Medications  Medication Sig Dispense Refill   albuterol (VENTOLIN HFA) 108 (90 Base) MCG/ACT inhaler Inhale 2 puffs into the lungs every 6 (six) hours as needed for wheezing or shortness of breath. 18 g 5   ARIPiprazole (ABILIFY) 5 MG tablet Take 1 tablet (5 mg total) by mouth daily. 30 tablet 3   cetirizine (ZYRTEC) 10 MG tablet Take 1 tablet (10 mg total) by mouth daily. 90 tablet 3   esomeprazole (NEXIUM) 40 MG capsule TAKE 1 CAPSULE(40 MG) BY MOUTH DAILY 90 capsule 1   fluticasone (FLONASE) 50 MCG/ACT nasal spray Place 2 sprays into both nostrils daily as needed. 18.2 mL 3   montelukast  (SINGULAIR) 10 MG tablet Take 1 tablet (10 mg total) by mouth at bedtime. 90 tablet 3   Semaglutide-Weight Management 0.5 MG/0.5ML SOAJ Inject 0.5 mg into the skin once a week. 2 mL 1   Vitamin D, Ergocalciferol, (DRISDOL) 1.25 MG (50000 UNIT) CAPS capsule Take 1 capsule (50,000 Units total) by mouth every 7 (seven) days. 12 capsule 1   No current facility-administered medications for this visit.     Musculoskeletal: Strength & Muscle Tone: within normal limits Gait & Station: normal Patient leans: N/A  Psychiatric Specialty Exam: Review of Systems  There were no vitals taken for this visit.There is no height or weight on file to calculate BMI.  General Appearance: Well Groomed  Eye Contact:  Good  Speech:  Clear and Coherent and Normal Rate  Volume:  Normal  Mood:  Euthymic  Affect:  Appropriate and Congruent  Thought Process:  Coherent, Goal Directed and Linear  Orientation:  Full (Time, Place, and Person)  Thought Content: WDL and Logical   Suicidal Thoughts:  No  Homicidal Thoughts:  No  Memory:  Immediate;   Good Recent;   Good Remote;   Good  Judgement:  Good  Insight:  Good  Psychomotor Activity:  Normal  Concentration:  Concentration: Good and Attention Span: Good  Recall:  Good  Fund of Knowledge: Good  Language: Good  Akathisia:  No  Handed:  Right  AIMS (if indicated):  not done  Assets:  Communication Skills Desire for Improvement Financial Resources/Insurance Housing Social Support  ADL's:  Intact  Cognition: WNL  Sleep:  Good   Screenings: AIMS    Flowsheet Row Clinical Support from 04/09/2023 in [facility]  AIMS Total Score 0      GAD-7    Flowsheet Row Clinical Support from 09/28/2023 in [facility] Office Visit from 09/24/2023 in [facility] at [facility] Video Visit from 07/09/2023 in [facility] Clinical Support  from  04/09/2023 in [facility] Office Visit from 03/17/2023 in [facility] at [facility]  Total GAD-7 Score 3 6 5 6 5       PHQ2-9    Flowsheet Row Nutrition from 09/29/2023 in [facility]  - A Dept Of [place] Clinical Support from 09/28/2023 in [facility] Office Visit from 09/24/2023 in [facility] Primary Care & Sports Medicine at [facility] Video Visit from 07/09/2023 in [facility] Office Visit from 07/06/2023 in [facility] for [facility] at [facility]  PHQ-2 Total Score 0 2 2 1  0  PHQ-9 Total Score -- 6 9 5  --      Flowsheet Row Counselor from 08/19/2022 in [facility] Clinical Support from 07/29/2022 in [facility] Video Visit from 03/12/2021 in [facility]  C-SSRS RISK CATEGORY No Risk Error: Q7 should not be populated when Q6 is No No Risk        Assessment and Plan: Patient reports that she is doing well on her current medication regimen.   No medication changes made today. Patient agreeable to continue Abilify as prescribed.   1. Bipolar 2 disorder, major depressive episode (HCC)  Continue- ARIPiprazole (ABILIFY) 5 MG tablet; Take 1 tablet (5 mg total) by mouth daily.  Dispense: 30 tablet; Refill: 3        Follow-up in 2 month Follow-up with therapy  Shanna Cisco, NP 12/03/2023, 12:06 PM

## 2023-12-24 ENCOUNTER — Ambulatory Visit (HOSPITAL_COMMUNITY): Payer: Medicaid Other | Admitting: Mental Health

## 2023-12-24 DIAGNOSIS — F411 Generalized anxiety disorder: Secondary | ICD-10-CM

## 2023-12-24 DIAGNOSIS — F3181 Bipolar II disorder: Secondary | ICD-10-CM | POA: Diagnosis not present

## 2023-12-24 DIAGNOSIS — E66813 Obesity, class 3: Secondary | ICD-10-CM

## 2023-12-24 NOTE — Progress Notes (Signed)
   THERAPIST PROGRESS NOTE  Session Time: 3:05 pm ( 30 minutes)  Participation Level: Active  Behavioral Response: CasualAlertEuthymic  Type of Therapy: Individual Therapy  Treatment Goals addressed: STG: Delisia increase management of moods(depressive/manic episodes) AEB development of effective coping skills with ability to reframe maladaptive thinking within the next 6 months    STG: Erica will increase management of stressors AEB development of effective decision making skills with ability to identify realistic vs unrealistic stressors within the next 6 months   ProgressTowards Goals: Progressing  Interventions: CBT and Supportive  Summary: Eshaal Duby is a 23 y.o. female who presents with dx of bipolar disorder, GAD and ADHD. Luan presents alert and oriented. Mood and affect stable, bright. Engaged and receptive to interventions.  Shares to for moods to have been stable and denies excessive highs or lows. Notes to feel s if she is doing well and denies stressors. 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 Shares increased balance in daily life and making appropriate decisions for self in regards to early adult hood life tasks. Upcoming presenting to school and able to process through schedule of school and work. Shares social life to be going well. Making appropriate progress with goal. Denies safety concerns.  Suicidal/Homicidal: Nowithout intent/plan  Therapist Response: Therapist engaged Lusero in therapy session. Check in and assessed for current level of functioning, sxs management and level of stressors. Provided safe space for Mellissa to shares thoughts and feelings in regards to current level of functioning and factors that are contributing to mental health stability. Reviewed ability to process through stressors and managing daily tasks as needed. Explored balance in daily life. Reviewed session and provided follow up.    Plan: Return again in  x 8 weeks.  Diagnosis: Bipolar 2 disorder, major depressive episode (HCC)  Generalized anxiety disorder  Class 3 severe obesity in adult, unspecified BMI, unspecified obesity type, unspecified whether serious comorbidity present [facility])  Collaboration of Care: Other None  Patient/Guardian was advised Release of Information must be obtained prior to any record release in order to collaborate their care with an outside provider. Patient/Guardian was advised if they have not already done so to contact the registration department to sign all necessary forms in order for [place] to release information regarding their care.   Consent: Patient/Guardian gives verbal consent for treatment and assignment of benefits for services provided during this visit. Patient/Guardian expressed understanding and agreed to proceed.   Stephan Minister [place], [facility] 12/24/2023

## 2023-12-30 ENCOUNTER — Ambulatory Visit (HOSPITAL_BASED_OUTPATIENT_CLINIC_OR_DEPARTMENT_OTHER): Admitting: Family Medicine

## 2024-01-22 ENCOUNTER — Ambulatory Visit (HOSPITAL_BASED_OUTPATIENT_CLINIC_OR_DEPARTMENT_OTHER): Payer: Medicaid Other | Admitting: Family Medicine

## 2024-01-22 ENCOUNTER — Encounter (HOSPITAL_BASED_OUTPATIENT_CLINIC_OR_DEPARTMENT_OTHER): Payer: Self-pay | Admitting: Family Medicine

## 2024-01-22 VITALS — BP 142/80 | HR 86 | Ht 68.0 in | Wt >= 6400 oz

## 2024-01-22 DIAGNOSIS — Z6841 Body Mass Index (BMI) 40.0 and over, adult: Secondary | ICD-10-CM | POA: Diagnosis not present

## 2024-01-22 DIAGNOSIS — D509 Iron deficiency anemia, unspecified: Secondary | ICD-10-CM | POA: Diagnosis not present

## 2024-01-22 DIAGNOSIS — M722 Plantar fascial fibromatosis: Secondary | ICD-10-CM

## 2024-01-22 DIAGNOSIS — R030 Elevated blood-pressure reading, without diagnosis of hypertension: Secondary | ICD-10-CM

## 2024-01-22 HISTORY — DX: Elevated blood-pressure reading, without diagnosis of hypertension: R03.0

## 2024-01-22 MED ORDER — SEMAGLUTIDE-WEIGHT MANAGEMENT 0.25 MG/0.5ML ~~LOC~~ SOAJ
0.2500 mg | SUBCUTANEOUS | 0 refills | Status: DC
Start: 2024-01-22 — End: 2024-04-25

## 2024-01-22 NOTE — Progress Notes (Addendum)
 Subjective:   Ashley Cantu [birth year] 01/22/2024  Chief Complaint  Patient presents with   Medical Management of Chronic Issues    79-month follow up; pt is here for a follow up for weight management. Denies any main concerns for today's visit.    HPI: Ashley Cantu presents today for re-assessment and management of chronic medical conditions.  WEIGHT MANAGEMENT: Ashley Cantu presents for weight management. Patient is following up for morbid obesity. She has lost approximately 10lbs . Currently using diet and exercising with frequent walking to assist in weight loss.   Adhering to healthy diet: Low Carbohydrate, watching sugar intake.  Regular exercise regimen:   Medications tried in the past: Patient was previously prescribed but never started medication.   Wt Readings from Last 3 Encounters:  01/22/24 (!) 492 lb 14.4 oz (223.6 kg)  09/24/23 (!) 502 lb (227.7 kg)  07/06/23 (!) 502 lb (227.7 kg)    IDA:  Ashley Cantu presents for the medical management of Anemia.  Current medication : None; pt was recommended to start OTC Iron supplement but has not.   Well controlled: Will repeat iron studies today. Patient reports menstruation is heavy lasting from 3-7 days.  Denies bloody stools, hematuria, excessive fatigue, palpitations, pica.  Patient is not seeing hematology for management. The following portions of the patient's history were reviewed and updated as appropriate: past medical history, past surgical history, family history, social history, allergies, medications, and problem list.   BILATERAL HEEL PAIN:  Patient states she has attempted shoe changes, NSAID, ice without improvement to bilateral heel pain from previous visit. She did start wearing a more heeled shoe at work and is able to tolerate standing for longer periods of time but still has bilateral heel pain daily.   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   ADHD (attention deficit hyperactivity disorder) 03/05/2020   Migraine without aura and without status migrainosus, not intractable 12/14/2019   Insulin resistance 11/28/2019   Vitamin D  deficiency 10/19/2019   Disequilibrium syndrome 07/12/2019   Frequent episodic tension-type headache, not intractable 07/12/2019   Vertigo 06/27/2019   Hyperuricemia 04/15/2019   Asthma 10/15/2018   Acanthosis nigricans 12/07/2017   Intermittent hypertension 12/02/2017   Deviated nasal septum 07/07/2017   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   Enlargement of right atrium 04/22/2017   Hepatic steatosis 04/22/2017   Gastroesophageal reflux disease 01/23/2017   Chronic non-seasonal allergic rhinitis 11/07/2016   Generalized anxiety disorder 09/10/2016   Binge eating 09/10/2016   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   Asthma when she was a baby   Atelectasis 11/07/2016   Bone spur    Bronchiectasis (HCC)    Bronchiectasis without complication (HCC) 07/02/2018   Cholelithiasis 04/01/2017   Cough 10/15/2018   Depression    Environmental allergies    Gallbladder problem  GERD (gastroesophageal reflux disease)    Headache    Heartburn    IBS (irritable bowel syndrome)    Joint pain    Lactose intolerance    Left genital labial abscess 08/06/2017   Migraine variant    Obesity    Obesity with serious comorbidity and body mass index (BMI) greater  than 99th percentile for age in pediatric patient 09/10/2016   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   PONV (postoperative nausea and vomiting)    with the rhinoplasty surgery   Tympanic membrane central perforation, 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   Vitamin D  deficiency    Past Surgical History:  Procedure Laterality Date   CHOLECYSTECTOMY N/A 04/01/2017   Procedure: LAPAROSCOPIC CHOLECYSTECTOMY;  Surgeon: Verlena Glenn, MD;  Location: [facility] OR;  Service: General;  Laterality: N/A;   RHINOPLASTY     VIDEO BRONCHOSCOPY WITH ENDOBRONCHIAL NAVIGATION N/A 07/11/2020   Procedure: VIDEO BRONCHOSCOPY WITH ENDOBRONCHIAL NAVIGATION  WITH INDOCYANINE GREEN /METHYLENE BLUE  MARKING INJECTION OF RIGHT UPPER LOBE MASS;  Surgeon: Norita Beauvais, MD;  Location: [facility] OR;  Service: Thoracic;  Laterality: N/A;   Family History  Problem Relation Age of Onset   Hypertension Mother    Mental illness Mother    Depression Mother    Bipolar disorder Mother    Schizophrenia Mother    Heart disease Mother    Liver disease Mother    Alcohol abuse Mother    Drug abuse Mother    Obesity Mother    Asthma Mother    Migraines Mother    High blood pressure Father    Depression Father    Sleep apnea Father    Obesity Father    Asthma Father    Migraines Father    COPD Maternal Grandmother    Asthma Maternal Grandmother    Breast cancer Maternal Grandmother    Schizophrenia Maternal Grandfather    Bipolar disorder Maternal Grandfather    Asthma Maternal Grandfather    Seizures Paternal Grandmother    Atrial fibrillation Paternal Grandmother    Asthma Paternal Grandmother    Breast cancer Paternal Grandmother    Diabetes Paternal Grandfather    Congestive Heart Failure Paternal Grandfather    Asthma Paternal Grandfather    Breast cancer Paternal Aunt 56   Outpatient Medications Prior to Visit  Medication Sig Dispense Refill   albuterol  (VENTOLIN  HFA) 108 (90 Base) MCG/ACT inhaler Inhale  2 puffs into the lungs every 6 (six) hours as needed for wheezing or shortness of breath. 18 g 5   ARIPiprazole  (ABILIFY ) 5 MG tablet Take 1 tablet (5 mg total) by mouth daily. 30 tablet 3   cetirizine  (ZYRTEC ) 10 MG tablet Take 1 tablet (10 mg total) by mouth daily. 90 tablet 3   esomeprazole  (NEXIUM ) 40 MG capsule TAKE 1 CAPSULE(40 MG) BY MOUTH DAILY 90 capsule 1   fluticasone  (FLONASE ) 50 MCG/ACT nasal spray Place 2 sprays into both nostrils daily as needed. 18.2 mL 3   montelukast  (SINGULAIR ) 10 MG tablet Take 1 tablet (10 mg total) by mouth at bedtime. 90 tablet 3   Vitamin D , Ergocalciferol , (DRISDOL ) 1.25 MG (50000 UNIT) CAPS capsule Take 1 capsule (50,000 Units total) by mouth every 7 (seven) days. 12 capsule 1   Semaglutide -Weight Management 0.5 MG/0.5ML SOAJ Inject 0.5 mg into the skin once a week. 2 mL 1   No facility-administered medications prior to visit.   Allergies  Allergen Reactions   Penicillins Anaphylaxis, Hives and Swelling  Has patient had a PCN reaction causing immediate rash, facial/tongue/throat swelling, SOB or lightheadedness with hypotension: yes Has patient had a PCN reaction causing severe rash involving mucus membranes or skin necrosis: no Has patient had a PCN reaction that required hospitalization: no Has patient had a PCN reaction occurring within the last 10 years: bo If all of the above answers are "NO", then may proceed with Cephalosporin use.      ROS: A complete ROS was performed with pertinent positives/negatives noted in the HPI. The remainder of the ROS are negative.    Objective:   Today's Vitals   01/22/24 1013 01/22/24 1052  BP: (!) 143/84 (!) 142/80  Pulse: 86   SpO2: 100%   Weight: (!) 492 lb 14.4 oz (223.6 kg)   Height: 5\' 8"  (1.727 m)     Physical Exam          GENERAL: Well-appearing, in NAD. Morbidly obese  SKIN: Pink, warm and dry.  Head: Normocephalic. NECK: Trachea midline. Full ROM w/o pain or tenderness.   RESPIRATORY: Chest wall symmetrical. Respirations even and non-labored.  MSK: Muscle tone and strength appropriate for age. + tenderness with palpation to bilateral heels with reproducible pain to plantar surface of heels bilaterally. Full ROM present bilaterally.  EXTREMITIES: Without clubbing, cyanosis, or edema.  NEUROLOGIC: No motor or sensory deficits. Steady, even gait. C2-C12 intact.  PSYCH/MENTAL STATUS: Alert, oriented x 3. Cooperative, appropriate mood and affect.      Assessment & Plan:  1. Morbid obesity with BMI of 70 and over, adult (HCC) (Primary) Uncontrolled. Patient did have 10lbs weight loss approx. In the past 4 months with diet, exericse and lifestyle changes. Recommend she continue this change and start Wegovy  0.25mg  weekly. Patient to reach out to PCP if unable to fill with [facility]. Recommend continue nutrition counseling.  - Semaglutide -Weight Management 0.25 MG/0.5ML SOAJ; Inject 0.25 mg into the skin once a week.  Dispense: 2 mL; Refill: 0  2. Plantar fasciitis of right foot Uncontrolled. Pt would benefit from steroid injection to heels bilaterally for chronic plantar fascia pain. Pt agreeable and referral placed. Continue NSAID use, ice, and orthotic shoe recommendation.  - Ambulatory referral to Podiatry  3. Microcytic anemia Uncontrolled due to chronic blood loss with menstruation. Pt did not start OTC Iron supplement. Recommend increasing diet sources of iron and reviewed food groups with patient. Will check labs today and likely prescribe PO iron if needed.  - CBC with Differential - Iron, TIBC and Ferritin Panel  4. Elevated BP without diagnosis of hypertension No previous uncontrolled BP. No signs of HTN on exam today. Recommend patient monitor BP, fluid and salt intake and return for nurse check of BP in 2 weeks.    Meds ordered this encounter  Medications   Semaglutide -Weight Management 0.25 MG/0.5ML SOAJ    Sig: Inject 0.25 mg into the skin once a  week.    Dispense:  2 mL    Refill:  0    Supervising Provider:   DE [place], RAYMOND J [[id number]]   Lab Orders         CBC with Differential         Iron, TIBC and Ferritin Panel      Return in about 3 months (around 04/23/2024) for BP Check 2 weeks; 3 months ANNUAL PHYSICAL (fasting labs day of) .    Patient to reach out to office if new, worrisome, or unresolved symptoms arise or if no improvement in patient's condition. Patient  verbalized understanding and is agreeable to treatment plan. All questions answered to patient's satisfaction.    Nonda Bays, [place]

## 2024-01-22 NOTE — Patient Instructions (Signed)
 IRON RICH FOODS:  Beef, pork, chicken, [place], scallops, shrimp, salmon, eggs, canned chunk like tuna, grains, fish eggs.  Peanut butter, beans (black, kidney, lima, pinto), lentils, tofu, hummus, dark leafy green vegetables (current greens, kale, broccoli), oatmeal.  Iron enriched breads, pastas, and cereals.

## 2024-01-23 LAB — CBC WITH DIFFERENTIAL/PLATELET
Basophils Absolute: 0.1 10*3/uL (ref 0.0–0.2)
Basos: 1 %
EOS (ABSOLUTE): 0.1 10*3/uL (ref 0.0–0.4)
Eos: 1 %
Hematocrit: 40.4 % (ref 34.0–46.6)
Hemoglobin: 12.4 g/dL (ref 11.1–15.9)
Immature Grans (Abs): 0 10*3/uL (ref 0.0–0.1)
Immature Granulocytes: 0 %
Lymphocytes Absolute: 2.5 10*3/uL (ref 0.7–3.1)
Lymphs: 25 %
MCH: 24 pg — ABNORMAL LOW (ref 26.6–33.0)
MCHC: 30.7 g/dL — ABNORMAL LOW (ref 31.5–35.7)
MCV: 78 fL — ABNORMAL LOW (ref 79–97)
Monocytes Absolute: 0.8 10*3/uL (ref 0.1–0.9)
Monocytes: 8 %
Neutrophils Absolute: 6.3 10*3/uL (ref 1.4–7.0)
Neutrophils: 65 %
Platelets: 284 10*3/uL (ref 150–450)
RBC: 5.16 x10E6/uL (ref 3.77–5.28)
RDW: 14.6 % (ref 11.7–15.4)
WBC: 9.7 10*3/uL (ref 3.4–10.8)

## 2024-01-23 LAB — IRON,TIBC AND FERRITIN PANEL
Ferritin: 12 ng/mL — ABNORMAL LOW (ref 15–150)
Iron Saturation: 12 % — ABNORMAL LOW (ref 15–55)
Iron: 50 ug/dL (ref 27–159)
Total Iron Binding Capacity: 431 ug/dL (ref 250–450)
UIBC: 381 ug/dL (ref 131–425)

## 2024-01-26 ENCOUNTER — Encounter (HOSPITAL_BASED_OUTPATIENT_CLINIC_OR_DEPARTMENT_OTHER): Payer: Self-pay | Admitting: Family Medicine

## 2024-01-26 ENCOUNTER — Other Ambulatory Visit (HOSPITAL_BASED_OUTPATIENT_CLINIC_OR_DEPARTMENT_OTHER): Payer: Self-pay | Admitting: Family Medicine

## 2024-01-26 DIAGNOSIS — D509 Iron deficiency anemia, unspecified: Secondary | ICD-10-CM

## 2024-01-26 MED ORDER — IRON (FERROUS SULFATE) 325 (65 FE) MG PO TABS
1.0000 | ORAL_TABLET | ORAL | 3 refills | Status: DC
Start: 2024-01-27 — End: 2024-04-27

## 2024-01-26 NOTE — Progress Notes (Signed)
 Hi Ashley Cantu,  Your CBC does still show iron deficiency anemia. I would recommend starting the iron supplement I have provided and taking 1 tablet on Monday, Wednesday and Friday (3 days a week total). We can recheck your labs at your appt in August. If you have further questions, please let me know. Reach out if you have not been able to pick up the Wegovy .

## 2024-01-29 ENCOUNTER — Ambulatory Visit: Admitting: Podiatry

## 2024-01-29 ENCOUNTER — Encounter (HOSPITAL_COMMUNITY): Payer: Self-pay

## 2024-01-29 DIAGNOSIS — M722 Plantar fascial fibromatosis: Secondary | ICD-10-CM | POA: Diagnosis not present

## 2024-01-29 MED ORDER — MELOXICAM 15 MG PO TABS: 15.0000 mg | ORAL_TABLET | Freq: Every day | ORAL | 0 refills | Status: AC

## 2024-01-29 MED ORDER — METHYLPREDNISOLONE 4 MG PO TBPK: ORAL_TABLET | ORAL | 0 refills | Status: DC

## 2024-01-29 NOTE — Progress Notes (Signed)
 Subjective:  Patient ID: Ashley Cantu, female    DOB: 2000-10-13,  MRN: [id number]  Chief Complaint  Patient presents with   Plantar Fasciitis    Pt stated that her primary told her she had some plantar fasciitis     23 y.o. female presents with the above complaint.  Patient presents with bilateral heel pain that has been for quite some time is progressive and worse worse with ambulation shoe pressure she wanted discuss treatment options for her.  She has not seen any worse but is in the process of nontender likely nature   Review of Systems: Negative except as noted in the HPI. Denies N/V/F/Ch.  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   Asthma when she was a baby   Atelectasis 11/07/2016   Bone spur    Bronchiectasis (HCC)    Bronchiectasis without complication (HCC) 07/02/2018   Cholelithiasis 04/01/2017   Cough 10/15/2018   Depression    Environmental allergies    Gallbladder problem    GERD (gastroesophageal reflux disease)    Headache    Heartburn    IBS (irritable bowel syndrome)    Joint pain    Lactose intolerance    Left genital labial abscess 08/06/2017   Migraine variant    Obesity    Obesity with serious comorbidity and body mass index (BMI) greater than 99th percentile for age in pediatric patient 09/10/2016   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   PONV (postoperative nausea and vomiting)    with the rhinoplasty surgery   Tympanic membrane central perforation, 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   Vitamin D  deficiency     Current Outpatient Medications:    meloxicam (MOBIC) 15 MG tablet, Take 1 tablet (15 mg total) by mouth daily., Disp: 30 tablet, Rfl: 0   methylPREDNISolone (MEDROL DOSEPAK) 4 MG TBPK tablet, Take as directed, Disp: 21 each, Rfl: 0    albuterol  (VENTOLIN  HFA) 108 (90 Base) MCG/ACT inhaler, Inhale 2 puffs into the lungs every 6 (six) hours as needed for wheezing or shortness of breath., Disp: 18 g, Rfl: 5   ARIPiprazole  (ABILIFY ) 5 MG tablet, Take 1 tablet (5 mg total) by mouth daily., Disp: 30 tablet, Rfl: 3   cetirizine  (ZYRTEC ) 10 MG tablet, Take 1 tablet (10 mg total) by mouth daily., Disp: 90 tablet, Rfl: 3   esomeprazole  (NEXIUM ) 40 MG capsule, TAKE 1 CAPSULE(40 MG) BY MOUTH DAILY, Disp: 90 capsule, Rfl: 1   fluticasone  (FLONASE ) 50 MCG/ACT nasal spray, Place 2 sprays into both nostrils daily as needed., Disp: 18.2 mL, Rfl: 3   Iron , Ferrous Sulfate , 325 (65 Fe) MG TABS, Take 1 tablet by mouth 3 (three) times a week., Disp: 30 tablet, Rfl: 3   montelukast  (SINGULAIR ) 10 MG tablet, Take 1 tablet (10 mg total) by mouth at bedtime., Disp: 90 tablet, Rfl: 3   Semaglutide -Weight Management 0.25 MG/0.5ML SOAJ, Inject 0.25 mg into the skin once a week., Disp: 2 mL, Rfl: 0   Vitamin D , Ergocalciferol , (DRISDOL ) 1.25 MG (50000 UNIT) CAPS capsule, Take 1 capsule (50,000 Units total) by mouth every 7 (seven) days., Disp: 12 capsule, Rfl: 1  Social History   Tobacco Use  Smoking Status Never   Passive exposure: Yes  Smokeless Tobacco Never  Allergies  Allergen Reactions   Penicillins Anaphylaxis, Hives and Swelling    Has patient had a PCN reaction causing immediate rash, facial/tongue/throat swelling, SOB or lightheadedness with hypotension: yes Has patient had a PCN reaction causing severe rash involving mucus membranes or skin necrosis: no Has patient had a PCN reaction that required hospitalization: no Has patient had a PCN reaction occurring within the last 10 years: bo If all of the above answers are "NO", then may proceed with Cephalosporin use.    Objective:  There were no vitals filed for this visit. There is no height or weight on file to calculate BMI. Constitutional Well developed. Well nourished.  Vascular  Dorsalis pedis pulses palpable bilaterally. Posterior tibial pulses palpable bilaterally. Capillary refill normal to all digits.  No cyanosis or clubbing noted. Pedal hair growth normal.  Neurologic Normal speech. Oriented to person, place, and time. Epicritic sensation to light touch grossly present bilaterally.  Dermatologic Nails well groomed and normal in appearance. No open wounds. No skin lesions.  Orthopedic: Normal joint ROM without pain or crepitus bilaterally. No visible deformities. Tender to palpation at the calcaneal tuber bilaterally. No pain with calcaneal squeeze bilaterally. Ankle ROM diminished range of motion bilaterally. Silfverskiold Test: positive bilaterally.   Radiographs: None pain  Assessment:   1. Plantar fasciitis of right foot   2. Plantar fasciitis of left foot    Plan:  Patient was evaluated and treated and all questions answered.  Plantar Fasciitis, leftbilaterally - XR reviewed as above.  - Educated on icing and stretching. Instructions given.  - Injection delivered to the plantar fascia as below. - DME: Plantar fascial brace dispensed to support the medial longitudinal arch of the foot and offload pressure from the heel and prevent arch collapse during weightbearing - Pharmacologic management: Ankle none  Procedure: Injection Tendon/Ligament Location: Bilateral plantar fascia at the glabrous junction; medial approach. Skin Prep: alcohol Injectate: 0.5 cc 0.5% marcaine  plain, 0.5 cc of 1% Lidocaine , 0.5 cc kenalog  10. Disposition: Patient tolerated procedure well. Injection site dressed with a band-aid.  No follow-ups on file.

## 2024-02-05 ENCOUNTER — Ambulatory Visit (HOSPITAL_BASED_OUTPATIENT_CLINIC_OR_DEPARTMENT_OTHER)

## 2024-02-10 ENCOUNTER — Ambulatory Visit (INDEPENDENT_AMBULATORY_CARE_PROVIDER_SITE_OTHER): Admitting: *Deleted

## 2024-02-10 VITALS — BP 130/81

## 2024-02-10 DIAGNOSIS — R030 Elevated blood-pressure reading, without diagnosis of hypertension: Secondary | ICD-10-CM | POA: Diagnosis not present

## 2024-02-10 NOTE — Progress Notes (Signed)
 Patient came in today for a BP check. Pt's BP was 130/81. BP documented. No concerns.

## 2024-02-11 ENCOUNTER — Encounter (HOSPITAL_COMMUNITY): Payer: Self-pay | Admitting: Psychiatry

## 2024-02-11 ENCOUNTER — Ambulatory Visit (INDEPENDENT_AMBULATORY_CARE_PROVIDER_SITE_OTHER): Admitting: Psychiatry

## 2024-02-11 DIAGNOSIS — F3181 Bipolar II disorder: Secondary | ICD-10-CM

## 2024-02-11 MED ORDER — ARIPIPRAZOLE 5 MG PO TABS
5.0000 mg | ORAL_TABLET | Freq: Every day | ORAL | 3 refills | Status: DC
Start: 2024-02-11 — End: 2024-06-14

## 2024-02-11 NOTE — Progress Notes (Signed)
 [facility] MD/PA/NP OP Progress Note      02/10/2021 2:58 PM Ashley Cantu  MRN:  [id number]  Chief Complaint: "Life is stressful"   HPI:23 y/o female seen today for follow up psychiatric evaluation. She has a psychiatric history of GAD, ADHD, Bipolar II disorder and OCD. Patient is currently prescribed Abilify  5 mg daily. She reports that Abilify  is are effective in managing her psychiatric conditions.  Today she was well-groomed, pleasant, cooperative, and engaged in conversation.  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  She informed that she continues to study accounting.  She also works full-time.  Patient informed that recently she was diagnosed with plantars fasciitis.  She was placed on steroids. She denies pain today. Patient notes that mentally she is in a good place.  Provider conducted GAD-7 and patient scored a 5. Provider also conducted a PHQ-9 and patient scored a 4.  She endorses adequate sleep and appetite.  Today she denies SI/HI/VAH, mania, paranoia.  Provider conducted an aims assessment and patient scored a 0.  No medication changes made today. She will continue Abilify  as prescribed. No other concerns at this time.   Visit Diagnosis:    ICD-10-CM   1. Bipolar 2 disorder, major depressive episode (HCC)  F31.81 ARIPiprazole  (ABILIFY ) 5 MG tablet           Past Psychiatric History: anxiety, depression, ADHD, and OCD  Past Medical History:  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   Asthma when she was a baby   Atelectasis 11/07/2016   Bone spur    Bronchiectasis (HCC)    Bronchiectasis without complication (HCC) 07/02/2018   Cholelithiasis 04/01/2017   Cough 10/15/2018   Depression    Environmental allergies    Gallbladder problem    GERD  (gastroesophageal reflux disease)    Headache    Heartburn    IBS (irritable bowel syndrome)    Joint pain    Lactose intolerance    Left genital labial abscess 08/06/2017   Migraine variant    Obesity    Obesity with serious comorbidity and body mass index (BMI) greater than 99th percentile for age in pediatric patient 09/10/2016   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   PONV (postoperative nausea and vomiting)    with the rhinoplasty surgery   Tympanic membrane central perforation, 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   Vitamin D  deficiency     Past Surgical History:  Procedure Laterality Date   CHOLECYSTECTOMY N/A 04/01/2017   Procedure: LAPAROSCOPIC CHOLECYSTECTOMY;  Surgeon: Verlena Glenn, MD;  Location: [facility] OR;  Service: General;  Laterality: N/A;   RHINOPLASTY     VIDEO BRONCHOSCOPY WITH ENDOBRONCHIAL NAVIGATION N/A 07/11/2020   Procedure: VIDEO BRONCHOSCOPY WITH ENDOBRONCHIAL NAVIGATION  WITH INDOCYANINE GREEN /METHYLENE BLUE  MARKING INJECTION OF RIGHT UPPER LOBE MASS;  Surgeon: Norita Beauvais, MD;  Location: [facility] OR;  Service: Thoracic;  Laterality: N/A;    Family Psychiatric History: Mother Schizoaffective Bipolar type  Family History:  Family History  Problem Relation Age of Onset   Hypertension Mother    Mental illness Mother    Depression Mother    Bipolar disorder Mother    Schizophrenia Mother    Heart disease Mother    Liver disease Mother  Alcohol abuse Mother    Drug abuse Mother    Obesity Mother    Asthma Mother    Migraines Mother    High blood pressure Father    Depression Father    Sleep apnea Father    Obesity Father    Asthma Father    Migraines Father    COPD Maternal Grandmother    Asthma Maternal Grandmother    Breast cancer Maternal Grandmother    Schizophrenia Maternal Grandfather    Bipolar disorder Maternal Grandfather    Asthma Maternal Grandfather    Seizures Paternal Grandmother    Atrial fibrillation Paternal  Grandmother    Asthma Paternal Grandmother    Breast cancer Paternal Grandmother    Diabetes Paternal Grandfather    Congestive Heart Failure Paternal Grandfather    Asthma Paternal Grandfather    Breast cancer Paternal Aunt 16    Social History:  Social History   Socioeconomic History   Marital status: Single    Spouse name: Not on file   Number of children: Not on file   Years of education: Not on file   Highest education level: Not on file  Occupational History   Occupation: Conservation officer, nature  Tobacco Use   Smoking status: Never    Passive exposure: Yes   Smokeless tobacco: Never  Vaping Use   Vaping status: Never Used  Substance and Sexual Activity   Alcohol use: No   Drug use: No   Sexual activity: Never    Birth control/protection: Abstinence  Other Topics Concern   Not on file  Social History Narrative   Laini received her GED.   She attended [employer].   She lives with her dad only.   She has a younger brother.   She is ambidextrous.   1 8oz cup caffeine daily   Social Drivers of Health   Financial Resource Strain: High Risk (03/17/2023)   Overall Financial Resource Strain (CARDIA)    Difficulty of Paying Living Expenses: Hard  Food Insecurity: Food Insecurity Present (09/29/2023)   Hunger Vital Sign    Worried About Running Out of Food in the Last Year: Sometimes true    Ran Out of Food in the Last Year: Never true  Transportation Needs: No Transportation Needs (03/17/2023)   PRAPARE - Administrator, Civil Service (Medical): No    Lack of Transportation (Non-Medical): No  Physical Activity: Sufficiently Active (03/17/2023)   Exercise Vital Sign    Days of Exercise per Week: 4 days    Minutes of Exercise per Session: 40 min  Stress: No Stress Concern Present (03/17/2023)   [employer] of Occupational Health - Occupational Stress Questionnaire    Feeling of Stress : Only a little  Social Connections: Socially Isolated (03/17/2023)   Social Connection  and Isolation Panel [NHANES]    Frequency of Communication with Friends and Family: More than three times a week    Frequency of Social Gatherings with Friends and Family: Twice a week    Attends Religious Services: Never    Database administrator or Organizations: No    Attends Banker Meetings: Never    Marital Status: Never married    Allergies:  Allergies  Allergen Reactions   Penicillins Anaphylaxis, Hives and Swelling    Has patient had a PCN reaction causing immediate rash, facial/tongue/throat swelling, SOB or lightheadedness with hypotension: yes Has patient had a PCN reaction causing severe rash involving mucus membranes or skin necrosis: no Has patient  had a PCN reaction that required hospitalization: no Has patient had a PCN reaction occurring within the last 10 years: bo If all of the above answers are "NO", then may proceed with Cephalosporin use.     Metabolic Disorder Labs: Lab Results  Component Value Date   HGBA1C 5.5 03/31/2023   No results found for: "PROLACTIN" Lab Results  Component Value Date   CHOL 146 03/31/2023   TRIG 127 03/31/2023   HDL 45 03/31/2023   CHOLHDL 3.2 03/31/2023   LDLCALC 78 03/31/2023   LDLCALC 79 03/05/2022   Lab Results  Component Value Date   TSH 2.880 03/05/2022   TSH 1.410 04/24/2020    Therapeutic Level Labs: No results found for: "LITHIUM" No results found for: "VALPROATE" No results found for: "CBMZ"  Current Medications: Current Outpatient Medications  Medication Sig Dispense Refill   albuterol  (VENTOLIN  HFA) 108 (90 Base) MCG/ACT inhaler Inhale 2 puffs into the lungs every 6 (six) hours as needed for wheezing or shortness of breath. 18 g 5   ARIPiprazole  (ABILIFY ) 5 MG tablet Take 1 tablet (5 mg total) by mouth daily. 30 tablet 3   cetirizine  (ZYRTEC ) 10 MG tablet Take 1 tablet (10 mg total) by mouth daily. 90 tablet 3   esomeprazole  (NEXIUM ) 40 MG capsule TAKE 1 CAPSULE(40 MG) BY MOUTH DAILY 90  capsule 1   fluticasone  (FLONASE ) 50 MCG/ACT nasal spray Place 2 sprays into both nostrils daily as needed. 18.2 mL 3   Iron , Ferrous Sulfate , 325 (65 Fe) MG TABS Take 1 tablet by mouth 3 (three) times a week. 30 tablet 3   meloxicam  (MOBIC ) 15 MG tablet Take 1 tablet (15 mg total) by mouth daily. 30 tablet 0   methylPREDNISolone  (MEDROL  DOSEPAK) 4 MG TBPK tablet Take as directed 21 each 0   montelukast  (SINGULAIR ) 10 MG tablet Take 1 tablet (10 mg total) by mouth at bedtime. 90 tablet 3   Semaglutide -Weight Management 0.25 MG/0.5ML SOAJ Inject 0.25 mg into the skin once a week. 2 mL 0   Vitamin D , Ergocalciferol , (DRISDOL ) 1.25 MG (50000 UNIT) CAPS capsule Take 1 capsule (50,000 Units total) by mouth every 7 (seven) days. 12 capsule 1   No current facility-administered medications for this visit.     Musculoskeletal: Strength & Muscle Tone: within normal limits Gait & Station: normal Patient leans: N/A  Psychiatric Specialty Exam: Review of Systems  Blood pressure (!) 149/96, pulse 90, height 5\' 8"  (1.727 m), weight (!) 491 lb (222.7 kg), last menstrual period 12/25/2023, SpO2 100%.Body mass index is 74.66 kg/m.  General Appearance: Well Groomed  Eye Contact:  Good  Speech:  Clear and Coherent and Normal Rate  Volume:  Normal  Mood:  Euthymic  Affect:  Appropriate and Congruent  Thought Process:  Coherent, Goal Directed and Linear  Orientation:  Full (Time, Place, and Person)  Thought Content: WDL and Logical   Suicidal Thoughts:  No  Homicidal Thoughts:  No  Memory:  Immediate;   Good Recent;   Good Remote;   Good  Judgement:  Good  Insight:  Good  Psychomotor Activity:  Normal  Concentration:  Concentration: Good and Attention Span: Good  Recall:  Good  Fund of Knowledge: Good  Language: Good  Akathisia:  No  Handed:  Right  AIMS (if indicated):  not done  Assets:  Communication Skills Desire for Improvement Financial Resources/Insurance Housing Social Support   ADL's:  Intact  Cognition: WNL  Sleep:  Good   Screenings: AIMS  Flowsheet Row Clinical Support from 04/09/2023 in [facility]  AIMS Total Score 0      GAD-7    Flowsheet Row Office Visit from 01/22/2024 in [facility] at [facility] Clinical Support from 09/28/2023 in [facility] Office Visit from 09/24/2023 in [facility] at [facility] Video Visit from 07/09/2023 in [facility] Clinical Support from 04/09/2023 in [facility]  Total GAD-7 Score 0 3 6 5 6       PHQ2-9    Flowsheet Row Office Visit from 01/22/2024 in [facility] at [facility] Nutrition from 09/29/2023 in [facility]  - A Dept Of [facility] Clinical Support from 09/28/2023 in [facility] Office Visit from 09/24/2023 in [facility] Medicine at [facility] Video Visit from 07/09/2023 in [facility]  PHQ-2 Total Score 0 0 2 2 1   PHQ-9 Total Score 4 -- 6 9 5       Flowsheet Row Counselor from 08/19/2022 in [facility] Clinical Support from 07/29/2022 in [facility] Video Visit from 03/12/2021 in [facility]  C-SSRS RISK CATEGORY No Risk Error: Q7 should not be populated when Q6 is No No Risk        Assessment and Plan: Patient reports that she she has stressors with work school and work but notes that she is able to cope with it.  No medication changes made today. Patient agreeable to continue Abilify  as prescribed.   1. Bipolar 2 disorder, major depressive episode (HCC)  Continue- ARIPiprazole  (ABILIFY ) 5 MG tablet; Take 1 tablet (5 mg total) by mouth daily.  Dispense: 30 tablet; Refill:  3        Follow-up in 2.5 month Follow-up with therapy  Arlyne Bering, NP 02/11/2024, 2:58 PM

## 2024-02-23 ENCOUNTER — Ambulatory Visit (HOSPITAL_COMMUNITY): Admitting: Mental Health

## 2024-02-23 ENCOUNTER — Encounter (HOSPITAL_COMMUNITY): Payer: Self-pay

## 2024-02-24 ENCOUNTER — Ambulatory Visit (HOSPITAL_COMMUNITY): Admitting: Mental Health

## 2024-04-14 ENCOUNTER — Encounter (HOSPITAL_COMMUNITY): Payer: Self-pay

## 2024-04-14 ENCOUNTER — Ambulatory Visit (HOSPITAL_COMMUNITY): Admitting: Mental Health

## 2024-04-14 DIAGNOSIS — F3181 Bipolar II disorder: Secondary | ICD-10-CM | POA: Diagnosis not present

## 2024-04-14 DIAGNOSIS — F411 Generalized anxiety disorder: Secondary | ICD-10-CM

## 2024-04-14 NOTE — Progress Notes (Signed)
   THERAPIST PROGRESS NOTE  Session Time: 3:01 pm   Participation Level: Active  Behavioral Response: CasualAlertDysphoric  Type of Therapy: Individual Therapy  Treatment Goals addressed: STG: Irelynn increase management of moods(depressive/manic episodes) AEB development of effective coping skills with ability to reframe maladaptive thinking within the next 6 months    STG: Sharolyn will increase management of stressors AEB development of effective decision making skills with ability to identify realistic vs unrealistic stressors within the next 6 months   ProgressTowards Goals: Progressing  Interventions: Supportive  Summary: Addley Ballinger is a 23 y.o. female who presents with dx of bipolar disorder, GAD and ADHD. Vy presents alert and oriented. Mood and affect low. Engaged and receptive to interventions. Speech clear and coherent at normal rate and tone. Engages in jovial manner with therapist. Reports to have not been able to re-engage in school noting need to figure out financial aide. Notes thoughts of working to take care of herself and shares engagement with social supports as needed. Notes some increase in depression with over thinking, crying spells. Shares continues to present to work and do things in which she has to as she has always been individual iln her family that has to attend to needs of others. Shares hx of mother who had mental illness concerns as well as used substances. Notes for father to be awaiting disability in which she feels she can have support with finances in the house hold and able to focus more on self. Agrees to work on Environmental manager thoughts and attendance to self care as means of coping.   Suicidal/Homicidal: Nowithout intent/plan  Therapist Response: Therapist engaged Delecia in therapy session. Check in and assessed for current level of functioning, sxs management and level of stressors. Provided safe space for Mellissa to shares thoughts and feelings in  regards to current level of functioning and factors that are contributing low moods. Provided safe space to share thoughts of mother and hx of having to take care of family and interactions with mother. Explored hx of use of humor to cope with feelings of sadness and presentation of bright affect. Supporting in allowing self to feel feelings and exploring what she feels is best for her. Reviewed session and provided follow up Plan: Return again in x7 weeks.  Diagnosis: Bipolar 2 disorder, major depressive episode (HCC)  Generalized anxiety disorder  Collaboration of Care: Other None  Patient/Guardian was advised Release of Information must be obtained prior to any record release in order to collaborate their care with an outside provider. Patient/Guardian was advised if they have not already done so to contact the registration department to sign all necessary forms in order for us  to release information regarding their care.   Consent: Patient/Guardian gives verbal consent for treatment and assignment of benefits for services provided during this visit. Patient/Guardian expressed understanding and agreed to proceed.   Ty Asal [place], [facility] 04/14/2024

## 2024-04-25 ENCOUNTER — Ambulatory Visit (INDEPENDENT_AMBULATORY_CARE_PROVIDER_SITE_OTHER): Admitting: Family Medicine

## 2024-04-25 ENCOUNTER — Encounter (HOSPITAL_BASED_OUTPATIENT_CLINIC_OR_DEPARTMENT_OTHER): Payer: Self-pay | Admitting: Family Medicine

## 2024-04-25 VITALS — BP 139/82 | HR 79 | Ht 68.0 in | Wt >= 6400 oz

## 2024-04-25 DIAGNOSIS — Z6841 Body Mass Index (BMI) 40.0 and over, adult: Secondary | ICD-10-CM

## 2024-04-25 DIAGNOSIS — I10 Essential (primary) hypertension: Secondary | ICD-10-CM

## 2024-04-25 DIAGNOSIS — L509 Urticaria, unspecified: Secondary | ICD-10-CM

## 2024-04-25 DIAGNOSIS — D500 Iron deficiency anemia secondary to blood loss (chronic): Secondary | ICD-10-CM | POA: Diagnosis not present

## 2024-04-25 DIAGNOSIS — Z0000 Encounter for general adult medical examination without abnormal findings: Secondary | ICD-10-CM

## 2024-04-25 MED ORDER — SEMAGLUTIDE-WEIGHT MANAGEMENT 0.5 MG/0.5ML ~~LOC~~ SOAJ: 0.5000 mg | SUBCUTANEOUS | 3 refills | Status: DC

## 2024-04-25 MED ORDER — SEMAGLUTIDE-WEIGHT MANAGEMENT 0.25 MG/0.5ML ~~LOC~~ SOAJ
0.2500 mg | SUBCUTANEOUS | 0 refills | Status: DC
Start: 2024-04-25 — End: 2024-07-27

## 2024-04-25 NOTE — Progress Notes (Signed)
 Subjective:   Ashley Cantu 12-28-2000  04/25/2024   CC: Chief Complaint  Patient presents with   Annual Exam    Patient is here today for her physical. Denies any main concerns for today's visit    HPI: Ashley Cantu is a 23 y.o. female who presents for a routine health maintenance exam.  Labs collected at time of visit.    HIVES:  Reports she has noticed episodes of hive-like rash occurring over the past 3 months. She denies known allergies to foods or soaps, creams, detergents. She states she is breaking out on hives that begins with breakout to her thighs and proceeds to cover her body. She states by 24 hours the hives are resolved. She is currently taking Claritin  daily and has been for several years due to chronic environmental allergies. Denies hx of allergy testing in the past. She denies SHOB, oral swelling, or anaphylaxis.   IDA:  Ashley Cantu presents for the medical management of Anemia.  Current medication : Ferrous Sulfate  325mg  Monday, Wed, Fri  Well controlled: Yes currently per patietn. Tolerating medication well.  Denies bloody stools, hematuria, excessive fatigue, palpitations, pica.  Patient is  seeing hematology for management.    Lab Results  Component Value Date   WBC 9.7 01/22/2024   HGB 12.4 01/22/2024   HCT 40.4 01/22/2024   MCV 78 (L) 01/22/2024   PLT 284 01/22/2024    Lab Results  Component Value Date   IRON  50 01/22/2024   TIBC 431 01/22/2024   FERRITIN 12 (L) 01/22/2024      HEALTH SCREENINGS: - Vision Screening: not applicable - Dental Visits: Recommended - Pap smear: up to date - Breast Exam: up to date - STD Screening: Declined - Mammogram (40+): Not applicable  - Colonoscopy (45+): Not applicable  - Bone Density (65+ or under 65 with predisposing conditions): Not applicable  - Lung CA screening with low-dose CT:  Not applicable Adults age 7-80 who are current cigarette smokers or quit within the last 15 years. Must have 20  pack year history.   Depression and Anxiety Screen done today and results listed below:     04/25/2024    9:50 AM 02/11/2024    2:59 PM 01/22/2024   10:17 AM 09/29/2023    2:06 PM 09/28/2023    1:12 PM  Depression screen PHQ 2/9  Decreased Interest 1  0 0   Down, Depressed, Hopeless 0  0 0   PHQ - 2 Score 1  0 0   Altered sleeping 2  0    Tired, decreased energy 1  0    Change in appetite 1  1    Feeling bad or failure about yourself  0  0    Trouble concentrating 2  3    Moving slowly or fidgety/restless 0  0    Suicidal thoughts 0  0    PHQ-9 Score 7  4    Difficult doing work/chores Not difficult at all  Not difficult at all       Information is confidential and restricted. Go to Review Flowsheets to unlock data.      04/25/2024    9:51 AM 02/11/2024    3:00 PM 01/22/2024   10:17 AM 09/28/2023    1:10 PM  GAD 7 : Generalized Anxiety Score  Nervous, Anxious, on Edge 2  0   Control/stop worrying 0  0   Worry too much - different things 0  0   Trouble  relaxing 1  0   Restless 0  0   Easily annoyed or irritable 0  0   Afraid - awful might happen 1  0   Total GAD 7 Score 4  0   Anxiety Difficulty Not difficult at all  Not difficult at all      Information is confidential and restricted. Go to Review Flowsheets to unlock data.    IMMUNIZATIONS: - Tdap: Tetanus vaccination status reviewed: last tetanus booster within 10 years. - HPV: Up to date - Influenza: Postponed to flu season - Pneumovax: Not applicable - Prevnar 20: Not applicable - Shingrix (50+): Not applicable   Past medical history, surgical history, medications, allergies, family history and social history reviewed with patient today and changes made to appropriate areas of the chart.   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   Aspergilloma (HCC)  09/28/2018   Asthma when she was a baby   Atelectasis 11/07/2016   Bone spur    Bronchiectasis (HCC)    Bronchiectasis without complication (HCC) 07/02/2018   Cholelithiasis 04/01/2017   Cough 10/15/2018   Depression    Elevated BP without diagnosis of hypertension 01/22/2024   Environmental allergies    Frequent episodic tension-type headache, not intractable 07/12/2019   Gallbladder problem    GERD (gastroesophageal reflux disease)    Headache    Heartburn    Hyperuricemia 04/15/2019   IBS (irritable bowel syndrome)    Joint pain    Lactose intolerance    Left genital labial abscess 08/06/2017   Migraine variant    Obesity    Obesity with serious comorbidity and body mass index (BMI) greater than 99th percentile for age in pediatric patient 09/10/2016   Plantar fasciitis of right foot 09/24/2023   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   PONV (postoperative nausea and vomiting)    with the rhinoplasty surgery   Tympanic membrane central perforation, 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   Vertigo 06/27/2019   Vitamin D  deficiency     Past Surgical History:  Procedure Laterality Date   CHOLECYSTECTOMY N/A 04/01/2017   Procedure: LAPAROSCOPIC CHOLECYSTECTOMY;  Surgeon: Chuckie Casimiro KIDD, MD;  Location: [facility] OR;  Service: General;  Laterality: N/A;   RHINOPLASTY     VIDEO BRONCHOSCOPY WITH ENDOBRONCHIAL NAVIGATION N/A 07/11/2020   Procedure: VIDEO BRONCHOSCOPY WITH ENDOBRONCHIAL NAVIGATION  WITH INDOCYANINE GREEN /METHYLENE BLUE  MARKING INJECTION OF RIGHT UPPER LOBE MASS;  Surgeon: Army Dallas NOVAK, MD;  Location: [facility] OR;  Service: Thoracic;  Laterality: N/A;    Current Outpatient Medications on File Prior to Visit  Medication Sig   albuterol  (VENTOLIN  HFA) 108 (90 Base) MCG/ACT inhaler Inhale 2 puffs into the lungs every 6 (six) hours as needed for wheezing or shortness of breath.   ARIPiprazole  (ABILIFY ) 5 MG tablet Take 1 tablet (5 mg total) by mouth daily.    cetirizine  (ZYRTEC ) 10 MG tablet Take 1 tablet (10 mg total) by mouth daily.   esomeprazole  (NEXIUM ) 40 MG capsule TAKE 1 CAPSULE(40 MG) BY MOUTH DAILY   fluticasone  (FLONASE ) 50 MCG/ACT nasal spray Place 2 sprays into both nostrils daily as needed.   Iron , Ferrous Sulfate , 325 (65 Fe) MG TABS Take 1 tablet by mouth 3 (three) times a week.   meloxicam  (MOBIC ) 15 MG tablet Take 1 tablet (  15 mg total) by mouth daily.   methylPREDNISolone  (MEDROL  DOSEPAK) 4 MG TBPK tablet Take as directed   montelukast  (SINGULAIR ) 10 MG tablet Take 1 tablet (10 mg total) by mouth at bedtime.   Vitamin D , Ergocalciferol , (DRISDOL ) 1.25 MG (50000 UNIT) CAPS capsule Take 1 capsule (50,000 Units total) by mouth every 7 (seven) days.   No current facility-administered medications on file prior to visit.    Allergies  Allergen Reactions   Penicillins Anaphylaxis, Hives and Swelling    Has patient had a PCN reaction causing immediate rash, facial/tongue/throat swelling, SOB or lightheadedness with hypotension: yes Has patient had a PCN reaction causing severe rash involving mucus membranes or skin necrosis: no Has patient had a PCN reaction that required hospitalization: no Has patient had a PCN reaction occurring within the last 10 years: bo If all of the above answers are NO, then may proceed with Cephalosporin use.      Social History   Socioeconomic History   Marital status: Single    Spouse name: Not on file   Number of children: Not on file   Years of education: Not on file   Highest education level: Not on file  Occupational History   Occupation: Conservation officer, nature  Tobacco Use   Smoking status: Never    Passive exposure: Yes   Smokeless tobacco: Never  Vaping Use   Vaping status: Never Used  Substance and Sexual Activity   Alcohol use: No   Drug use: No   Sexual activity: Never    Birth control/protection: Abstinence  Other Topics Concern   Not on file  Social History Narrative   Jennavieve received  her GED.   She attended [employer].   She lives with her dad only.   She has a younger brother.   She is ambidextrous.   1 8oz cup caffeine daily   Social Drivers of Health   Financial Resource Strain: High Risk (03/17/2023)   Overall Financial Resource Strain (CARDIA)    Difficulty of Paying Living Expenses: Hard  Food Insecurity: Food Insecurity Present (09/29/2023)   Hunger Vital Sign    Worried About Running Out of Food in the Last Year: Sometimes true    Ran Out of Food in the Last Year: Never true  Transportation Needs: No Transportation Needs (03/17/2023)   PRAPARE - Administrator, Civil Service (Medical): No    Lack of Transportation (Non-Medical): No  Physical Activity: Sufficiently Active (03/17/2023)   Exercise Vital Sign    Days of Exercise per Week: 4 days    Minutes of Exercise per Session: 40 min  Stress: No Stress Concern Present (03/17/2023)   [employer] of Occupational Health - Occupational Stress Questionnaire    Feeling of Stress : Only a little  Social Connections: Socially Isolated (03/17/2023)   Social Connection and Isolation Panel    Frequency of Communication with Friends and Family: More than three times a week    Frequency of Social Gatherings with Friends and Family: Twice a week    Attends Religious Services: Never    Database administrator or Organizations: No    Attends Banker Meetings: Never    Marital Status: Never married  Intimate Partner Violence: Not At Risk (03/17/2023)   Humiliation, Afraid, Rape, and Kick questionnaire    Fear of Current or Ex-Partner: No    Emotionally Abused: No    Physically Abused: No    Sexually Abused: No   Social History  Tobacco Use  Smoking Status Never   Passive exposure: Yes  Smokeless Tobacco Never   Social History   Substance and Sexual Activity  Alcohol Use No    Family History  Problem Relation Age of Onset   Hypertension Mother    Mental illness Mother    Depression  Mother    Bipolar disorder Mother    Schizophrenia Mother    Heart disease Mother    Liver disease Mother    Alcohol abuse Mother    Drug abuse Mother    Obesity Mother    Asthma Mother    Migraines Mother    High blood pressure Father    Depression Father    Sleep apnea Father    Obesity Father    Asthma Father    Migraines Father    COPD Maternal Grandmother    Asthma Maternal Grandmother    Breast cancer Maternal Grandmother    Schizophrenia Maternal Grandfather    Bipolar disorder Maternal Grandfather    Asthma Maternal Grandfather    Seizures Paternal Grandmother    Atrial fibrillation Paternal Grandmother    Asthma Paternal Grandmother    Breast cancer Paternal Grandmother    Diabetes Paternal Grandfather    Congestive Heart Failure Paternal Grandfather    Asthma Paternal Grandfather    Breast cancer Paternal Aunt 35     ROS: Denies fever, fatigue, unexplained weight loss/gain, chest pain, SHOB, and palpitations. Denies neurological deficits, gastrointestinal or genitourinary complaints, and skin changes.   Objective:   Today's Vitals   04/25/24 0948  BP: (!) 147/84  Pulse: 79  SpO2: 100%  Weight: (!) 495 lb 9.6 oz (224.8 kg)  Height: 5' 8 (1.727 m)    GENERAL APPEARANCE: Well-appearing, in NAD. Obese.  SKIN: Pink, warm and dry. Turgor normal. No rash, lesion, ulceration, or ecchymoses. Hair evenly distributed.  HEENT: HEAD: Normocephalic.  EYES: PERRLA. EOMI. Lids intact w/o defect. Sclera white, Conjunctiva pink w/o exudate.  EARS: External ear w/o redness, swelling, masses or lesions. EAC clear. TM's intact, translucent w/o bulging, appropriate landmarks visualized. Appropriate acuity to conversational tones.  NOSE: Septum midline w/o deformity. Nares patent, mucosa pink and non-inflamed w/o drainage. No sinus tenderness.  THROAT: Uvula midline. Oropharynx clear. Tonsils non-inflamed w/o exudate. Oral mucosa pink and moist.  NECK: Supple, Trachea  midline. Full ROM w/o pain or tenderness. No lymphadenopathy. Thyroid non-tender w/o enlargement or palpable masses.  RESPIRATORY: Chest wall symmetrical w/o masses. Respirations even and non-labored. Breath sounds clear to auscultation bilaterally. No wheezes, rales, rhonchi, or crackles. CARDIAC: S1, S2 present, regular rate and rhythm. No gallops, murmurs, rubs, or clicks. PMI w/o lifts, heaves, or thrills. No carotid bruits. Capillary refill <2 seconds. Peripheral pulses 2+ bilaterally. GI: Abdomen soft w/o distention. Normoactive bowel sounds. No palpable masses or tenderness. No guarding or rebound tenderness. Liver and spleen w/o tenderness or enlargement. No CVA tenderness.  MSK: Muscle tone and strength appropriate for age, w/o atrophy or abnormal movement.  EXTREMITIES: Active ROM intact, w/o tenderness, crepitus, or contracture. No obvious joint deformities or effusions. No clubbing, edema, or cyanosis.  NEUROLOGIC: CN's II-XII intact. Motor strength symmetrical with no obvious weakness. No sensory deficits. DTR's 2+ symmetric bilaterally. Steady, even gait.  PSYCH/MENTAL STATUS: Alert, oriented x 3. Cooperative, appropriate mood and affect.    Results for orders placed or performed in visit on 01/22/24  CBC with Differential   Collection Time: 01/22/24 10:49 AM  Result Value Ref Range   WBC 9.7 3.4 -  10.8 x10E3/uL   RBC 5.16 3.77 - 5.28 x10E6/uL   Hemoglobin 12.4 11.1 - 15.9 g/dL   Hematocrit 59.5 65.9 - 46.6 %   MCV 78 (L) 79 - 97 fL   MCH 24.0 (L) 26.6 - 33.0 pg   MCHC 30.7 (L) 31.5 - 35.7 g/dL   RDW 85.3 88.2 - 84.5 %   Platelets 284 150 - 450 x10E3/uL   Neutrophils 65 Not Estab. %   Lymphs 25 Not Estab. %   Monocytes 8 Not Estab. %   Eos 1 Not Estab. %   Basos 1 Not Estab. %   Neutrophils Absolute 6.3 1.4 - 7.0 x10E3/uL   Lymphocytes Absolute 2.5 0.7 - 3.1 x10E3/uL   Monocytes Absolute 0.8 0.1 - 0.9 x10E3/uL   EOS (ABSOLUTE) 0.1 0.0 - 0.4 x10E3/uL   Basophils Absolute  0.1 0.0 - 0.2 x10E3/uL   Immature Granulocytes 0 Not Estab. %   Immature Grans (Abs) 0.0 0.0 - 0.1 x10E3/uL  Iron , TIBC and Ferritin Panel   Collection Time: 01/22/24 10:49 AM  Result Value Ref Range   Total Iron  Binding Capacity 431 250 - 450 ug/dL   UIBC 618 868 - 574 ug/dL   Iron  50 27 - 159 ug/dL   Iron  Saturation 12 (L) 15 - 55 %   Ferritin 12 (L) 15 - 150 ng/mL    Assessment & Plan:  1. Annual physical exam (Primary) Discussed preventative screenings, vaccines, and healthy lifestyle with patient.   2. Morbid obesity with BMI of 70 and over, adult [facility]) Discussed healthy dietary changes and exercise. Patient started and tolerated her wegovy  well without side effects but did not request refill of her dosage when finishing Wegovy  0.25mg .  - Semaglutide -Weight Management 0.25 MG/0.5ML SOAJ; Inject 0.25 mg into the skin once a week.  Dispense: 2 mL; Refill: 0  3. Hives Possible allergic reaction. Recommended allergic testing and change from Claritin  to a Zyrtec  or Allegra. Discussed keeping journal/diary of intake and reaction to look for common denominator of cause.  - Ambulatory referral to Allergy  4. Iron  deficiency anemia due to chronic blood loss Controlled. Will check CBC, Iron  panel today.  - Iron , TIBC and Ferritin Panel - CBC with Differential/Platelet  5. Intermittent hypertension Borderline. She states BP improves at home. Will continue to monitor as her BP does improve with manual recehck.    Orders Placed This Encounter  Procedures   Iron , TIBC and Ferritin Panel   CBC with Differential/Platelet   Ambulatory referral to Allergy    Referral Priority:   Routine    Referral Type:   Allergy Testing    Referral Reason:   Specialty Services Required    Requested Specialty:   Allergy    Number of Visits Requested:   1    PATIENT COUNSELING:  - Encouraged a healthy well-balanced diet. Patient may adjust caloric intake to maintain or achieve ideal body weight. May  reduce intake of dietary saturated fat and total fat and have adequate dietary potassium and calcium  preferably from fresh fruits, vegetables, and low-fat dairy products.   - Advised to avoid cigarette smoking. - Discussed with the patient that most people either abstain from alcohol or drink within safe limits (<=14/week and <=4 drinks/occasion for males, <=7/weeks and <= 3 drinks/occasion for females) and that the risk for alcohol disorders and other health effects rises proportionally with the number of drinks per week and how often a drinker exceeds daily limits. - Discussed cessation/primary prevention of drug  use and availability of treatment for abuse.  - Discussed sexually transmitted diseases, avoidance of unintended pregnancy and contraceptive alternatives.  - Stressed the importance of regular exercise - Injury prevention: Discussed safety belts, safety helmets, smoke detector, smoking near bedding or upholstery.  - Dental health: Discussed importance of regular tooth brushing, flossing, and dental visits.   NEXT PREVENTATIVE PHYSICAL DUE IN 1 YEAR.  Return in about 3 months (around 07/26/2024) for Follow up Weight Management .  Patient to reach out to office if new, worrisome, or unresolved symptoms arise or if no improvement in patient's condition. Patient verbalized understanding and is agreeable to treatment plan. All questions answered to patient's satisfaction.    Thersia Schuyler Stark, [place]

## 2024-04-25 NOTE — Patient Instructions (Signed)

## 2024-04-26 ENCOUNTER — Ambulatory Visit (HOSPITAL_BASED_OUTPATIENT_CLINIC_OR_DEPARTMENT_OTHER): Payer: Self-pay | Admitting: Family Medicine

## 2024-04-26 DIAGNOSIS — D500 Iron deficiency anemia secondary to blood loss (chronic): Secondary | ICD-10-CM

## 2024-04-26 LAB — CBC WITH DIFFERENTIAL/PLATELET
Basophils Absolute: 0 x10E3/uL (ref 0.0–0.2)
Basos: 1 %
EOS (ABSOLUTE): 0.1 x10E3/uL (ref 0.0–0.4)
Eos: 1 %
Hematocrit: 40.6 % (ref 34.0–46.6)
Hemoglobin: 12.1 g/dL (ref 11.1–15.9)
Immature Grans (Abs): 0 x10E3/uL (ref 0.0–0.1)
Immature Granulocytes: 0 %
Lymphocytes Absolute: 2.2 x10E3/uL (ref 0.7–3.1)
Lymphs: 27 %
MCH: 23.9 pg — ABNORMAL LOW (ref 26.6–33.0)
MCHC: 29.8 g/dL — ABNORMAL LOW (ref 31.5–35.7)
MCV: 80 fL (ref 79–97)
Monocytes Absolute: 0.6 x10E3/uL (ref 0.1–0.9)
Monocytes: 7 %
Neutrophils Absolute: 5.2 x10E3/uL (ref 1.4–7.0)
Neutrophils: 64 %
Platelets: 301 x10E3/uL (ref 150–450)
RBC: 5.07 x10E6/uL (ref 3.77–5.28)
RDW: 14.6 % (ref 11.7–15.4)
WBC: 8.1 x10E3/uL (ref 3.4–10.8)

## 2024-04-26 LAB — IRON,TIBC AND FERRITIN PANEL
Ferritin: 14 ng/mL — ABNORMAL LOW (ref 15–150)
Iron Saturation: 9 % — CL (ref 15–55)
Iron: 36 ug/dL (ref 27–159)
Total Iron Binding Capacity: 410 ug/dL (ref 250–450)
UIBC: 374 ug/dL (ref 131–425)

## 2024-04-26 NOTE — Progress Notes (Signed)
 Hi Katonya,  Your iron  has not improved much unfortunately. At this time, we could consider a different oral form such as Accrufer  that may be better absorbed. Additionally, if heavy periods are contributing, using a form of birth control can help to correct this. If you do not want to try either of these, an iron  transfusion may be helpful. Please let me know what you would prefer to do.

## 2024-04-27 ENCOUNTER — Other Ambulatory Visit (HOSPITAL_BASED_OUTPATIENT_CLINIC_OR_DEPARTMENT_OTHER): Payer: Self-pay | Admitting: Family Medicine

## 2024-04-27 DIAGNOSIS — D500 Iron deficiency anemia secondary to blood loss (chronic): Secondary | ICD-10-CM

## 2024-04-27 MED ORDER — ACCRUFER 30 MG PO CAPS
1.0000 | ORAL_CAPSULE | Freq: Two times a day (BID) | ORAL | 3 refills | Status: AC
Start: 2024-04-27 — End: ?

## 2024-04-29 NOTE — Telephone Encounter (Signed)
 Called and spoke with pt letting her know the med was sent to the pharmacy for her and told her to make sure she stopped taking all other iron  meds at this time once beginning new med. Pt verbalized understanding. Scheduled pt for a repeat lab visit in 3 months.

## 2024-05-01 ENCOUNTER — Telehealth (HOSPITAL_BASED_OUTPATIENT_CLINIC_OR_DEPARTMENT_OTHER): Payer: Self-pay | Admitting: Family Medicine

## 2024-05-01 NOTE — Telephone Encounter (Signed)
 Copied from CRM [id number]. Topic: Clinical - Medication Prior Auth >> Apr 29, 2024  4:04 PM Elle L wrote: Reason for CRM: The patient needs a prior authorization for her Semaglutide -Weight Management 0.5 MG/0.5ML SOAJ AND Semaglutide -Weight Management 0.25 MG/0.5ML SOAJ.

## 2024-05-02 ENCOUNTER — Other Ambulatory Visit (HOSPITAL_BASED_OUTPATIENT_CLINIC_OR_DEPARTMENT_OTHER): Payer: Self-pay | Admitting: Family Medicine

## 2024-05-02 DIAGNOSIS — K219 Gastro-esophageal reflux disease without esophagitis: Secondary | ICD-10-CM

## 2024-05-02 NOTE — Telephone Encounter (Signed)
 Prior shara was initiated and it was approved:  Lanasia Porras (KeyBETHA BRUNET) - [id number] Wegovy 0.5MG /0.5ML auto-injectors status: PA Response - ApprovedCreated: August 5th, 2025 [phone]: August 6th, 2025    Mychart sent to pt checking to see if she was able to pick med up.

## 2024-05-05 ENCOUNTER — Encounter (HOSPITAL_COMMUNITY): Admitting: Physician Assistant

## 2024-06-08 ENCOUNTER — Ambulatory Visit (INDEPENDENT_AMBULATORY_CARE_PROVIDER_SITE_OTHER): Admitting: Mental Health

## 2024-06-08 DIAGNOSIS — F3181 Bipolar II disorder: Secondary | ICD-10-CM | POA: Diagnosis not present

## 2024-06-08 NOTE — Progress Notes (Signed)
   THERAPIST PROGRESS NOTE  Session Time: 11:08 am   Participation Level: Active  Behavioral Response: CasualAlertEuthymic  Type of Therapy: Individual Therapy  Treatment Goals addressed: STG: Clyda increase management of moods(depressive/manic episodes) AEB development of effective coping skills with ability to reframe maladaptive thinking within the next 6 months    STG: Lashonne will increase management of stressors AEB development of effective decision making skills with ability to identify realistic vs unrealistic stressors within the next 6 months   ProgressTowards Goals: Progressing  Interventions: Supportive  Summary: Marriana Hibberd is a 23 y.o. female who presents with dx of bipolar disorder, GAD and ADHD. Ryker presents alert and oriented. Mood and affect stable, bright. Engaged and receptive to interventions. Speech clear and coherent at normal rate and tone. Engages in jovial manner with therapist.  Raelyn to be doing well and for moods to be stable. Shares to have been doing better with medications taking them daily. Notes increase in attendance to routine and reports gym attendance, presenting to work and to be getting good regular sleep. Shares thoughts on benefits of these changes. Notes thoughts on making decision to transfer to college not in the area and explores benefits and drawbacks of presenting to school in town vs another area. Shares feelings of anxiety of not living in area but desire to pursue life. Shares has been working to reduce and cease engagement with family members in which she feels are toxic for her. Shares moods to be stable; denies maladaptive thinking at this time. Working to make appropriate and effective choices for self. Denies safety concerns.  Suicidal/Homicidal: Nowithout intent/plan  Therapist Response: Therapist engaged Iriel in therapy session. Check in and assessed for current level of functioning, sxs management and level of stressors.  Provided safe space for Mellissa to shares thoughts and feelings in regards to current level of functioning and factors that have contributed to mood stability. Affirmed ability to engage in daily routine and explores benefits. Supported in processing concerns for desire to present to school in [place] and explored thoughts and anxieties around this possible future choice. Reviewed ability to engage in effective choices for self. Reviewed session and provided follo wup  Plan: Return again in x 5 weeks.  Diagnosis: Bipolar 2 disorder, major depressive episode (HCC)  Collaboration of Care: Other None  Patient/Guardian was advised Release of Information must be obtained prior to any record release in order to collaborate their care with an outside provider. Patient/Guardian was advised if they have not already done so to contact the registration department to sign all necessary forms in order for us  to release information regarding their care.   Consent: Patient/Guardian gives verbal consent for treatment and assignment of benefits for services provided during this visit. Patient/Guardian expressed understanding and agreed to proceed.   Ty Asal [place], [facility] 06/08/2024

## 2024-06-14 ENCOUNTER — Encounter (HOSPITAL_COMMUNITY): Payer: Self-pay | Admitting: Psychiatry

## 2024-06-14 ENCOUNTER — Ambulatory Visit (INDEPENDENT_AMBULATORY_CARE_PROVIDER_SITE_OTHER): Admitting: Psychiatry

## 2024-06-14 DIAGNOSIS — F3181 Bipolar II disorder: Secondary | ICD-10-CM | POA: Diagnosis not present

## 2024-06-14 MED ORDER — ARIPIPRAZOLE 5 MG PO TABS: 5.0000 mg | ORAL_TABLET | Freq: Every day | ORAL | 3 refills | Status: DC

## 2024-06-14 NOTE — Progress Notes (Signed)
 [facility] MD/PA/NP OP Progress Note      06/14/2024 11:56 AM Ashley Cantu  MRN:  [id number]  Chief Complaint: I got angry when I was off of Abilify    HPI:22 y/o female seen today for follow up psychiatric evaluation. She has a psychiatric history of GAD, ADHD, Bipolar II disorder and OCD. Patient is currently prescribed Abilify  5 mg daily. She reports that Abilify  is are effective in managing her psychiatric conditions.  Today she was well-groomed, pleasant, cooperative, and engaged in conversation.  Patient informed writer that  when she ran out of her Abilify  and she became angry.  She does note that at times since restarting Abilify  she notes that her mood is stable.  She inform her that she has minimal anxiety depression. Provider conducted GAD-7 and patient scored a 5. Provider also conducted a PHQ-9 and patient scored a 6.  She endorses adequate sleep however notes that at times Abilify  makes her tired.  She notes that she sleeps approximately 7 to 8 hours.  Patient reports that she has adequate appetite and reports she has lost 30 pounds with the assistance of Wegovy  and exercising.  Today she denies SI/HI/VAH, mania, paranoia.  No medication changes made today. She will take Abilify  at night to help manage continue. No other concerns at this time.   Visit Diagnosis:    ICD-10-CM   1. 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   Asthma when she was a baby   Atelectasis 11/07/2016   Bone spur    Bronchiectasis (HCC)    Bronchiectasis without complication (HCC) 07/02/2018   Cholelithiasis 04/01/2017    Cough 10/15/2018   Depression    Elevated BP without diagnosis of hypertension 01/22/2024   Environmental allergies    Frequent episodic tension-type headache, not intractable 07/12/2019   Gallbladder problem    GERD (gastroesophageal reflux disease)    Headache    Heartburn    Hyperuricemia 04/15/2019   IBS (irritable bowel syndrome)    Joint pain    Lactose intolerance    Left genital labial abscess 08/06/2017   Migraine variant    Obesity    Obesity with serious comorbidity and body mass index (BMI) greater than 99th percentile for age in pediatric patient 09/10/2016   Plantar fasciitis of right foot 09/24/2023   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   PONV (postoperative nausea and vomiting)    with the rhinoplasty surgery   Tympanic membrane central perforation, 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   Vertigo 06/27/2019   Vitamin D  deficiency     Past Surgical History:  Procedure Laterality Date   CHOLECYSTECTOMY N/A 04/01/2017   Procedure: LAPAROSCOPIC CHOLECYSTECTOMY;  Surgeon: Chuckie Casimiro KIDD, MD;  Location: [facility] OR;  Service: General;  Laterality: N/A;   RHINOPLASTY     VIDEO BRONCHOSCOPY WITH ENDOBRONCHIAL NAVIGATION N/A 07/11/2020   Procedure: VIDEO BRONCHOSCOPY WITH ENDOBRONCHIAL NAVIGATION  WITH INDOCYANINE GREEN /METHYLENE BLUE  MARKING INJECTION OF RIGHT UPPER LOBE MASS;  Surgeon: Army Dallas NOVAK, MD;  Location: [facility] OR;  Service: Thoracic;  Laterality: N/A;  Family Psychiatric History: Mother Schizoaffective Bipolar type  Family History:  Family History  Problem Relation Age of Onset   Hypertension Mother    Mental illness Mother    Depression Mother    Bipolar disorder Mother    Schizophrenia Mother    Heart disease Mother    Liver disease Mother    Alcohol abuse Mother    Drug abuse Mother    Obesity Mother    Asthma Mother    Migraines Mother    High blood pressure Father    Depression Father    Sleep apnea Father    Obesity Father     Asthma Father    Migraines Father    COPD Maternal Grandmother    Asthma Maternal Grandmother    Breast cancer Maternal Grandmother    Schizophrenia Maternal Grandfather    Bipolar disorder Maternal Grandfather    Asthma Maternal Grandfather    Seizures Paternal Grandmother    Atrial fibrillation Paternal Grandmother    Asthma Paternal Grandmother    Breast cancer Paternal Grandmother    Diabetes Paternal Grandfather    Congestive Heart Failure Paternal Grandfather    Asthma Paternal Grandfather    Breast cancer Paternal Aunt 27    Social History:  Social History   Socioeconomic History   Marital status: Single    Spouse name: Not on file   Number of children: Not on file   Years of education: Not on file   Highest education level: Not on file  Occupational History   Occupation: Conservation officer, nature  Tobacco Use   Smoking status: Never    Passive exposure: Yes   Smokeless tobacco: Never  Vaping Use   Vaping status: Never Used  Substance and Sexual Activity   Alcohol use: No   Drug use: No   Sexual activity: Never    Birth control/protection: Abstinence  Other Topics Concern   Not on file  Social History Narrative   Graciana received her GED.   She attended [employer].   She lives with her dad only.   She has a younger brother.   She is ambidextrous.   1 8oz cup caffeine daily   Social Drivers of Health   Financial Resource Strain: High Risk (03/17/2023)   Overall Financial Resource Strain (CARDIA)    Difficulty of Paying Living Expenses: Hard  Food Insecurity: Food Insecurity Present (09/29/2023)   Hunger Vital Sign    Worried About Running Out of Food in the Last Year: Sometimes true    Ran Out of Food in the Last Year: Never true  Transportation Needs: No Transportation Needs (03/17/2023)   PRAPARE - Administrator, Civil Service (Medical): No    Lack of Transportation (Non-Medical): No  Physical Activity: Sufficiently Active (03/17/2023)   Exercise Vital Sign     Days of Exercise per Week: 4 days    Minutes of Exercise per Session: 40 min  Stress: No Stress Concern Present (03/17/2023)   [employer] of Occupational Health - Occupational Stress Questionnaire    Feeling of Stress : Only a little  Social Connections: Socially Isolated (03/17/2023)   Social Connection and Isolation Panel    Frequency of Communication with Friends and Family: More than three times a week    Frequency of Social Gatherings with Friends and Family: Twice a week    Attends Religious Services: Never    Database administrator or Organizations: No    Attends Banker Meetings: Never  Marital Status: Never married    Allergies:  Allergies  Allergen Reactions   Penicillins Anaphylaxis, Hives and Swelling    Has patient had a PCN reaction causing immediate rash, facial/tongue/throat swelling, SOB or lightheadedness with hypotension: yes Has patient had a PCN reaction causing severe rash involving mucus membranes or skin necrosis: no Has patient had a PCN reaction that required hospitalization: no Has patient had a PCN reaction occurring within the last 10 years: bo If all of the above answers are NO, then may proceed with Cephalosporin use.     Metabolic Disorder Labs: Lab Results  Component Value Date   HGBA1C 5.5 03/31/2023   No results found for: PROLACTIN Lab Results  Component Value Date   CHOL 146 03/31/2023   TRIG 127 03/31/2023   HDL 45 03/31/2023   CHOLHDL 3.2 03/31/2023   LDLCALC 78 03/31/2023   LDLCALC 79 03/05/2022   Lab Results  Component Value Date   TSH 2.880 03/05/2022   TSH 1.410 04/24/2020    Therapeutic Level Labs: No results found for: LITHIUM No results found for: VALPROATE No results found for: CBMZ  Current Medications: Current Outpatient Medications  Medication Sig Dispense Refill   albuterol  (VENTOLIN  HFA) 108 (90 Base) MCG/ACT inhaler Inhale 2 puffs into the lungs every 6 (six) hours as needed for  wheezing or shortness of breath. 18 g 5   ARIPiprazole  (ABILIFY ) 5 MG tablet Take 1 tablet (5 mg total) by mouth daily. 30 tablet 3   cetirizine  (ZYRTEC ) 10 MG tablet Take 1 tablet (10 mg total) by mouth daily. 90 tablet 3   esomeprazole  (NEXIUM ) 40 MG capsule TAKE 1 CAPSULE(40 MG) BY MOUTH DAILY 90 capsule 1   Ferric Maltol  (ACCRUFER ) 30 MG CAPS Take 1 capsule (30 mg total) by mouth 2 (two) times daily. 60 capsule 3   fluticasone  (FLONASE ) 50 MCG/ACT nasal spray Place 2 sprays into both nostrils daily as needed. 18.2 mL 3   meloxicam  (MOBIC ) 15 MG tablet Take 1 tablet (15 mg total) by mouth daily. 30 tablet 0   methylPREDNISolone  (MEDROL  DOSEPAK) 4 MG TBPK tablet Take as directed 21 each 0   montelukast  (SINGULAIR ) 10 MG tablet Take 1 tablet (10 mg total) by mouth at bedtime. 90 tablet 3   Semaglutide -Weight Management 0.25 MG/0.5ML SOAJ Inject 0.25 mg into the skin once a week. 2 mL 0   Semaglutide -Weight Management 0.5 MG/0.5ML SOAJ Inject 0.5 mg into the skin once a week. 2 mL 3   Vitamin D , Ergocalciferol , (DRISDOL ) 1.25 MG (50000 UNIT) CAPS capsule Take 1 capsule (50,000 Units total) by mouth every 7 (seven) days. 12 capsule 1   No current facility-administered medications for this visit.     Musculoskeletal: Strength & Muscle Tone: within normal limits Gait & Station: normal Patient leans: N/A  Psychiatric Specialty Exam: Review of Systems  Blood pressure 130/79, pulse 88, temperature 98.6 F (37 C), SpO2 100%.There is no height or weight on file to calculate BMI.  General Appearance: Well Groomed  Eye Contact:  Good  Speech:  Clear and Coherent and Normal Rate  Volume:  Normal  Mood:  Euthymic  Affect:  Appropriate and Congruent  Thought Process:  Coherent, Goal Directed and Linear  Orientation:  Full (Time, Place, and Person)  Thought Content: WDL and Logical   Suicidal Thoughts:  No  Homicidal Thoughts:  No  Memory:  Immediate;   Good Recent;   Good Remote;   Good   Judgement:  Good  Insight:  Good  Psychomotor Activity:  Normal  Concentration:  Concentration: Good and Attention Span: Good  Recall:  Good  Fund of Knowledge: Good  Language: Good  Akathisia:  No  Handed:  Right  AIMS (if indicated):  not done  Assets:  Communication Skills Desire for Improvement Financial Resources/Insurance Housing Social Support  ADL's:  Intact  Cognition: WNL  Sleep:  Good   Screenings: AIMS    Flowsheet Row Clinical Support from 02/11/2024 in [facility] Clinical Support from 04/09/2023 in [facility]  AIMS Total Score 0 0   GAD-7    Flowsheet Row Clinical Support from 06/14/2024 in [facility] Office Visit from 04/25/2024 in [facility] Primary Care & Sports Medicine at [facility] Clinical Support from 02/11/2024 in [facility] Office Visit from 01/22/2024 in [facility] Primary Care & Sports Medicine at [facility] Clinical Support from 09/28/2023 in [facility]  Total GAD-7 Score 5 4 5  0 3   PHQ2-9    Flowsheet Row Clinical Support from 06/14/2024 in [facility] Office Visit from 04/25/2024 in [facility] Primary Care & Sports Medicine at [facility] Clinical Support from 02/11/2024 in [facility] Office Visit from 01/22/2024 in [facility] Primary Care & Sports Medicine at [facility] Nutrition from 09/29/2023 in [facility]  - A Dept Of [place]  PHQ-2 Total Score 1 1 1  0 0  PHQ-9 Total Score 6 7 4 4  --   Flowsheet Row Counselor from 08/19/2022 in [facility] Clinical Support from 07/29/2022 in [facility] Video Visit from 03/12/2021 in [facility]  C-SSRS RISK CATEGORY No Risk Error: Q7 should not be populated when Q6  is No No Risk     Assessment and Plan: Patient reports that at times Abilify  makes her sleepy.  Provider recommend taking it at night.  She endorsed understanding and agreed.    1. Bipolar 2 disorder, major depressive episode (HCC)  Continue- ARIPiprazole  (ABILIFY ) 5 MG tablet; Take 1 tablet (5 mg total) by mouth daily.  Dispense: 30 tablet; Refill: 3        Follow-up in 2.5 month Follow-up with therapy  Zane FORBES Bach, NP 06/14/2024, 11:56 AM

## 2024-06-20 ENCOUNTER — Other Ambulatory Visit (HOSPITAL_BASED_OUTPATIENT_CLINIC_OR_DEPARTMENT_OTHER): Payer: Self-pay | Admitting: Family Medicine

## 2024-06-20 DIAGNOSIS — K219 Gastro-esophageal reflux disease without esophagitis: Secondary | ICD-10-CM

## 2024-07-18 ENCOUNTER — Encounter (HOSPITAL_COMMUNITY): Payer: Self-pay

## 2024-07-26 ENCOUNTER — Ambulatory Visit (INDEPENDENT_AMBULATORY_CARE_PROVIDER_SITE_OTHER): Admitting: Mental Health

## 2024-07-26 DIAGNOSIS — F3181 Bipolar II disorder: Secondary | ICD-10-CM

## 2024-07-26 DIAGNOSIS — F411 Generalized anxiety disorder: Secondary | ICD-10-CM

## 2024-07-26 NOTE — Progress Notes (Unsigned)
   THERAPIST PROGRESS NOTE  Session Time: 2:01 pm ( 55 minutes)  Participation Level: Active  Behavioral Response: CasualAlertEuthymic  Type of Therapy: Individual Therapy  Treatment Goals addressed: STG: Ashley Cantu increase management of moods(depressive/manic episodes) AEB development of effective coping skills with ability to reframe maladaptive thinking within the next 6 months    STG: Ashley Cantu will increase management of stressors AEB development of effective decision making skills with ability to identify realistic vs unrealistic stressors within the next 6 months   ProgressTowards Goals: Progressing  Interventions: CBT and Supportive  Summary: Ashley Cantu is a 23 y.o. female who presents with dx of bipolar disorder, GAD and ADHD. Ashley Cantu presents alert and oriented. Mood and affect stable, bright. Engaged and receptive to interventions. Speech clear and coherent at normal rate and tone. Engages in jovial manner with therapist. Ashley Cantu moods have been like a stress ball.  Notes ability to decipher what is and what is not in her control and working to not take on additional stressors that are not her own. Denies mania or depressive moods. Notes to have been able to sign back up for courses for upcoming semester and shares thoughts about re-engagement in school and future goals. Shares with therapist hx of factors that guided her decision making progress and barriers to ability to live life in manner in which she chooses. Reports ability to engage in increased balanced thinking with no distortions in thought present at this time. Denies safety concerns, ongoing work towards goals.   Suicidal/Homicidal: Nowithout intent/plan  Therapist Response: Therapist engaged Ashley Cantu in therapy session. Check in and assessed for current level of functioning, sxs management and level of stressors. Provided safe space for Ashley Cantu to shares thoughts and feelings in regards to current level of functioning  and factors that have contributed to feelings of stress. Supported in processing stressors and ways in which family dynamics play a role. Explored ability to identify what she can and can not control and boundary with taking on unneeded stressors. Encouraged ongoing focus on personal goals and ability to engage in life in the manner in which she aligns with. Reviewed session and provided follow up.   Plan: Return again in x 5 weeks.  Diagnosis: Bipolar 2 disorder, major depressive episode (HCC)  Generalized anxiety disorder  Collaboration of Care: Other none  Patient/Guardian was advised Release of Information must be obtained prior to any record release in order to collaborate their care with an outside provider. Patient/Guardian was advised if they have not already done so to contact the registration department to sign all necessary forms in order for us  to release information regarding their care.   Consent: Patient/Guardian gives verbal consent for treatment and assignment of benefits for services provided during this visit. Patient/Guardian expressed understanding and agreed to proceed.   Ty Asal [place], [facility] 07/26/2024

## 2024-07-27 ENCOUNTER — Ambulatory Visit (INDEPENDENT_AMBULATORY_CARE_PROVIDER_SITE_OTHER): Admitting: Family Medicine

## 2024-07-27 ENCOUNTER — Encounter (HOSPITAL_BASED_OUTPATIENT_CLINIC_OR_DEPARTMENT_OTHER): Payer: Self-pay | Admitting: Family Medicine

## 2024-07-27 VITALS — BP 125/83 | HR 79 | Ht 68.0 in | Wt >= 6400 oz

## 2024-07-27 DIAGNOSIS — D500 Iron deficiency anemia secondary to blood loss (chronic): Secondary | ICD-10-CM

## 2024-07-27 DIAGNOSIS — M722 Plantar fascial fibromatosis: Secondary | ICD-10-CM

## 2024-07-27 DIAGNOSIS — Z6841 Body Mass Index (BMI) 40.0 and over, adult: Secondary | ICD-10-CM | POA: Diagnosis not present

## 2024-07-27 NOTE — Progress Notes (Signed)
 Subjective:   Ashley Cantu [birth year] 07/27/2024  Chief Complaint  Patient presents with   Medical Management of Chronic Issues    76-month follow up on weight; pt denies any main concerns for today's visit.     HPI: Ashley Cantu presents today for re-assessment and management of chronic medical conditions. IDA:  Ashley Cantu presents for the medical management of Anemia.  Current medication : Accrufer  1 capsule BID  Well controlled: Yes currently. She is tolerating well without side effects.  Denies bloody stools, hematuria, excessive fatigue, palpitations, pica.    Lab Results  Component Value Date   WBC 8.1 04/25/2024   HGB 12.1 04/25/2024   HCT 40.6 04/25/2024   MCV 80 04/25/2024   PLT 301 04/25/2024    Lab Results  Component Value Date   IRON  36 04/25/2024   TIBC 410 04/25/2024   FERRITIN 14 (L) 04/25/2024     WEIGHT MANAGEMENT: Ashley Cantu presents for weight management.  Adhering to healthy diet: Yes, she is managing currently with diet and exercise only as Wegovy  was discontinued to Medicaid changes.  Current dietary plan: Low Carb, high protein Regular exercise regimen: Walking  Medications tried in the past: Wegovy   Wt Readings from Last 3 Encounters:  07/27/24 (!) 482 lb (218.6 kg)  04/25/24 (!) 495 lb 9.6 oz (224.8 kg)  01/22/24 (!) 492 lb 14.4 oz (223.6 kg)    Plantar Fasciitis:  She did receive injection into both heels for plantar fasciitis in May 2025. She did start using an OTC insole to help with prevention. She has noticed improvement in left heel but not improvement in the right heel. Currently using Tylenol  for severe pain. States the right heel has pain present to the side instead of the back of the heel previously.   The following portions of the patient's history were reviewed and updated as appropriate: past medical history, past surgical history, family history, social history, allergies, medications, and problem list.    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   ADHD (attention deficit hyperactivity disorder) 03/05/2020   Migraine without aura and without status migrainosus, not intractable 12/14/2019   Insulin resistance 11/28/2019   Vitamin D  deficiency 10/19/2019   Disequilibrium syndrome 07/12/2019   Asthma 10/15/2018   Acanthosis nigricans 12/07/2017   Intermittent hypertension 12/02/2017   Deviated nasal septum 07/07/2017   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   Enlargement of right atrium 04/22/2017   Hepatic steatosis 04/22/2017   Gastroesophageal reflux disease 01/23/2017   Chronic non-seasonal allergic rhinitis 11/07/2016   Generalized anxiety disorder 09/10/2016   Binge eating 09/10/2016   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   Asthma when she was a baby   Atelectasis 11/07/2016   Bone spur    Bronchiectasis (HCC)    Bronchiectasis without complication (HCC) 07/02/2018   Cholelithiasis 04/01/2017  Cough 10/15/2018   Depression    Elevated BP without diagnosis of hypertension 01/22/2024   Environmental allergies    Frequent episodic tension-type headache, not intractable 07/12/2019   Gallbladder problem    GERD (gastroesophageal reflux disease)    Headache    Heartburn    Hyperuricemia 04/15/2019   IBS (irritable bowel syndrome)    Joint pain    Lactose intolerance     Left genital labial abscess 08/06/2017   Migraine variant    Obesity    Obesity with serious comorbidity and body mass index (BMI) greater than 99th percentile for age in pediatric patient 09/10/2016   Plantar fasciitis of right foot 09/24/2023   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   PONV (postoperative nausea and vomiting)    with the rhinoplasty surgery   Tympanic membrane central perforation, 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   Vertigo 06/27/2019   Vitamin D  deficiency    Past Surgical History:  Procedure Laterality Date   CHOLECYSTECTOMY N/A 04/01/2017   Procedure: LAPAROSCOPIC CHOLECYSTECTOMY;  Surgeon: Chuckie Casimiro KIDD, MD;  Location: [facility] OR;  Service: General;  Laterality: N/A;   RHINOPLASTY     VIDEO BRONCHOSCOPY WITH ENDOBRONCHIAL NAVIGATION N/A 07/11/2020   Procedure: VIDEO BRONCHOSCOPY WITH ENDOBRONCHIAL NAVIGATION  WITH INDOCYANINE GREEN /METHYLENE BLUE  MARKING INJECTION OF RIGHT UPPER LOBE MASS;  Surgeon: Army Dallas NOVAK, MD;  Location: [facility] OR;  Service: Thoracic;  Laterality: N/A;   Family History  Problem Relation Age of Onset   Hypertension Mother    Mental illness Mother    Depression Mother    Bipolar disorder Mother    Schizophrenia Mother    Heart disease Mother    Liver disease Mother    Alcohol abuse Mother    Drug abuse Mother    Obesity Mother    Asthma Mother    Migraines Mother    High blood pressure Father    Depression Father    Sleep apnea Father    Obesity Father    Asthma Father    Migraines Father    COPD Maternal Grandmother    Asthma Maternal Grandmother    Breast cancer Maternal Grandmother    Schizophrenia Maternal Grandfather    Bipolar disorder Maternal Grandfather    Asthma Maternal Grandfather    Seizures Paternal Grandmother    Atrial fibrillation Paternal Grandmother    Asthma Paternal Grandmother    Breast cancer Paternal Grandmother    Diabetes Paternal Grandfather    Congestive Heart Failure Paternal  Grandfather    Asthma Paternal Grandfather    Breast cancer Paternal Aunt 70   Outpatient Medications Prior to Visit  Medication Sig Dispense Refill   albuterol  (VENTOLIN  HFA) 108 (90 Base) MCG/ACT inhaler Inhale 2 puffs into the lungs every 6 (six) hours as needed for wheezing or shortness of breath. 18 g 5   ARIPiprazole  (ABILIFY ) 5 MG tablet Take 1 tablet (5 mg total) by mouth daily. 30 tablet 3   cetirizine  (ZYRTEC ) 10 MG tablet Take 1 tablet (10 mg total) by mouth daily. 90 tablet 3   esomeprazole  (NEXIUM ) 40 MG capsule TAKE 1 CAPSULE(40 MG) BY MOUTH DAILY 90 capsule 1   Ferric Maltol  (ACCRUFER ) 30 MG CAPS Take 1 capsule (30 mg total) by mouth 2 (two) times daily. 60 capsule 3   fluticasone  (FLONASE ) 50 MCG/ACT nasal spray Place 2 sprays into both nostrils daily as needed. 18.2 mL 3   meloxicam  (MOBIC ) 15 MG tablet Take 1 tablet (15 mg total) by mouth  daily. 30 tablet 0   montelukast  (SINGULAIR ) 10 MG tablet Take 1 tablet (10 mg total) by mouth at bedtime. 90 tablet 3   Vitamin D , Ergocalciferol , (DRISDOL ) 1.25 MG (50000 UNIT) CAPS capsule Take 1 capsule (50,000 Units total) by mouth every 7 (seven) days. 12 capsule 1   methylPREDNISolone  (MEDROL  DOSEPAK) 4 MG TBPK tablet Take as directed 21 each 0   Semaglutide -Weight Management 0.25 MG/0.5ML SOAJ Inject 0.25 mg into the skin once a week. 2 mL 0   Semaglutide -Weight Management 0.5 MG/0.5ML SOAJ Inject 0.5 mg into the skin once a week. 2 mL 3   No facility-administered medications prior to visit.   Allergies  Allergen Reactions   Penicillins Anaphylaxis, Hives and Swelling    Has patient had a PCN reaction causing immediate rash, facial/tongue/throat swelling, SOB or lightheadedness with hypotension: yes Has patient had a PCN reaction causing severe rash involving mucus membranes or skin necrosis: no Has patient had a PCN reaction that required hospitalization: no Has patient had a PCN reaction occurring within the last 10 years:  bo If all of the above answers are NO, then may proceed with Cephalosporin use.      ROS: A complete ROS was performed with pertinent positives/negatives noted in the HPI. The remainder of the ROS are negative.    Objective:   Today's Vitals   07/27/24 1001  BP: 125/83  Pulse: 79  SpO2: 100%  Weight: (!) 482 lb (218.6 kg)  Height: 5' 8 (1.727 m)    Physical Exam   GENERAL: Well-appearing, in NAD. Well nourished.  SKIN: Pink, warm and dry.  Head: Normocephalic. NECK: Trachea midline. Full ROM w/o pain or tenderness.  RESPIRATORY: Chest wall symmetrical. Respirations even and non-labored. Breath sounds clear to auscultation bilaterally.  CARDIAC: S1, S2 present, regular rate and rhythm without murmur or gallops. Peripheral pulses 2+ bilaterally.  MSK: Muscle tone and strength appropriate for age. Pain to medial aspect of right ankle with flexion. No reproducible pain with palpation.  EXTREMITIES: Without clubbing, cyanosis, or edema.  NEUROLOGIC: No motor or sensory deficits. Steady, even gait. C2-C12 intact.  PSYCH/MENTAL STATUS: Alert, oriented x 3. Cooperative, appropriate mood and affect.      Assessment & Plan:  1. Iron  deficiency anemia due to chronic blood loss (Primary) Patient tolerating iron  supplementation well.  Will check iron  panel and CBC with lab work today and titrate as needed.  She is currently asymptomatic for anemia. - Iron , TIBC and Ferritin Panel - CBC with Differential/Platelet  2. Morbid obesity with BMI of 70 and over, adult [facility]) Discussed discontinuation of Wegovy  due to Medicaid.  We discussed other options such as medications for appetite suppression, referral to medical weight management, healthy eating, regular exercise.  Patient will consider possible referral and let PCP know if she is interested in this.  3. Plantar fasciitis of right foot Recommend patient to schedule appointment with podiatry for possible steroid injection to affected  area of right heel.  Recommend she also obtain proper footwear with arch support for prevention of flareup of plantar fasciitis.  This can be done with podiatry.  Return in about 9 months (around 04/26/2025) for ANNUAL PHYSICAL.    Patient to reach out to office if new, worrisome, or unresolved symptoms arise or if no improvement in patient's condition. Patient verbalized understanding and is agreeable to treatment plan. All questions answered to patient's satisfaction.    Thersia Schuyler Stark, [place]

## 2024-07-27 NOTE — Patient Instructions (Signed)
 Call Podiatry to schedule injection and possible orthotic insoles for heel pain.

## 2024-07-28 LAB — CBC WITH DIFFERENTIAL/PLATELET
Basophils Absolute: 0 x10E3/uL (ref 0.0–0.2)
Basos: 0 %
EOS (ABSOLUTE): 0.1 x10E3/uL (ref 0.0–0.4)
Eos: 1 %
Hematocrit: 43.7 % (ref 34.0–46.6)
Hemoglobin: 13.3 g/dL (ref 11.1–15.9)
Immature Grans (Abs): 0 x10E3/uL (ref 0.0–0.1)
Immature Granulocytes: 0 %
Lymphocytes Absolute: 2.3 x10E3/uL (ref 0.7–3.1)
Lymphs: 25 %
MCH: 25.3 pg — ABNORMAL LOW (ref 26.6–33.0)
MCHC: 30.4 g/dL — ABNORMAL LOW (ref 31.5–35.7)
MCV: 83 fL (ref 79–97)
Monocytes Absolute: 0.6 x10E3/uL (ref 0.1–0.9)
Monocytes: 6 %
Neutrophils Absolute: 6.1 x10E3/uL (ref 1.4–7.0)
Neutrophils: 68 %
Platelets: 312 x10E3/uL (ref 150–450)
RBC: 5.26 x10E6/uL (ref 3.77–5.28)
RDW: 14.1 % (ref 11.7–15.4)
WBC: 9.1 x10E3/uL (ref 3.4–10.8)

## 2024-07-28 LAB — IRON,TIBC AND FERRITIN PANEL
Ferritin: 21 ng/mL (ref 15–150)
Iron Saturation: 11 % — ABNORMAL LOW (ref 15–55)
Iron: 47 ug/dL (ref 27–159)
Total Iron Binding Capacity: 415 ug/dL (ref 250–450)
UIBC: 368 ug/dL (ref 131–425)

## 2024-07-29 ENCOUNTER — Ambulatory Visit (HOSPITAL_BASED_OUTPATIENT_CLINIC_OR_DEPARTMENT_OTHER): Payer: Self-pay | Admitting: Family Medicine

## 2024-07-29 NOTE — Progress Notes (Signed)
 Hi Sharlena, Your iron  has improved and your blood counts are slowly improving.  Please continue on the Accrufer  at this time.

## 2024-08-10 ENCOUNTER — Ambulatory Visit (HOSPITAL_BASED_OUTPATIENT_CLINIC_OR_DEPARTMENT_OTHER)

## 2024-09-05 ENCOUNTER — Encounter (HOSPITAL_COMMUNITY): Admitting: Psychiatry

## 2024-09-07 ENCOUNTER — Telehealth (HOSPITAL_COMMUNITY): Admitting: Psychiatry

## 2024-09-07 ENCOUNTER — Encounter (HOSPITAL_COMMUNITY): Payer: Self-pay | Admitting: Psychiatry

## 2024-09-07 DIAGNOSIS — F3181 Bipolar II disorder: Secondary | ICD-10-CM

## 2024-09-07 MED ORDER — ARIPIPRAZOLE 2 MG PO TABS: 2.0000 mg | ORAL_TABLET | Freq: Every day | ORAL | 3 refills | Status: AC

## 2024-09-07 NOTE — Progress Notes (Signed)
 [facility] MD/PA/NP OP Progress Note Virtual Visit via Video Note  I connected with Ashley Cantu on 09/07/2024 at 12:30 PM EST by a video enabled telemedicine application and verified that I am speaking with the correct person using two identifiers.  Location: Patient: Home Provider: Clinic   I discussed the limitations of evaluation and management by telemedicine and the availability of in person appointments. The patient expressed understanding and agreed to proceed.  I provided 30 minutes of non-face-to-face time during this encounter.        09/07/2024 12:45 PM Ashley Cantu  MRN:  [id number]  Chief Complaint: My emotions are evened out but I need Abilify    HPI:23 y/o female seen today for follow up psychiatric evaluation. She has a psychiatric history of GAD, ADHD, Bipolar II disorder and OCD. Patient is currently prescribed Abilify  5 mg daily. She reports that she as been without Abilify  for a month.   Today she was well-groomed, pleasant, cooperative, and engaged in conversation.  Patient informed clinical research associate that she has been without her Abilify  for a month.  She reports that she discontinued it because she was having increased fatigue.  Patient reports that when she first stopped it she had intense depression but now notes that her mood has evened out.  She informed clinical research associate that she is more productive on Abilify .  Patient describes having racing thoughts, fluctuations in moods, and mild irritability.  She also notes that at times she is distracted.    Since discontinuing Abilify  she notes that her anxiety and her depression has increased.  She informed clinical research associate that she finds herself checking things more often.  She notes that she checks the stove to see if she turned it off numerous times.  Today provider conducted GAD-7 and patient scored a 10, at her last visit she scored a 5. Provider also conducted a PHQ-9 and patient scored a 10, at her last visit she scored a 6.  She endorses  fair  sleep sleep .  She notes that she sleeps approximately 5 to 13 hours.  Patient reports that she has adequate appetite.  Today she denies SI/HI/VAH. Patient notes that she feels paranoid at times. She fears that something bad will happen.   Today Abilify  2 mg restarted to help manage mood and depression as 5 mg caused increased fatigue.  Provider informed patient that a mood stabilizer or antidepressant can be trialed at the future if Abilify  make her fatigued.  She endorsed understanding and agreed.  No other concerns noted at this time.  Visit Diagnosis:    ICD-10-CM   1. Bipolar 2 disorder, major depressive episode (HCC)  F31.81 ARIPiprazole  (ABILIFY ) 2 MG tablet             Past Psychiatric History: anxiety, depression, ADHD, and OCD  Past Medical History:  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   Asthma when she was a baby   Atelectasis 11/07/2016   Bone spur    Bronchiectasis (HCC)    Bronchiectasis without complication (HCC) 07/02/2018   Cholelithiasis 04/01/2017   Cough 10/15/2018   Depression    Elevated BP without diagnosis of hypertension 01/22/2024   Environmental allergies    Frequent episodic tension-type headache, not intractable 07/12/2019   Gallbladder problem  GERD (gastroesophageal reflux disease)    Headache    Heartburn    Hyperuricemia 04/15/2019   IBS (irritable bowel syndrome)    Joint pain    Lactose intolerance    Left genital labial abscess 08/06/2017   Migraine variant    Obesity    Obesity with serious comorbidity and body mass index (BMI) greater than 99th percentile for age in pediatric patient 09/10/2016   Plantar fasciitis of right foot 09/24/2023   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   PONV (postoperative  nausea and vomiting)    with the rhinoplasty surgery   Tympanic membrane central perforation, 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   Vertigo 06/27/2019   Vitamin D  deficiency     Past Surgical History:  Procedure Laterality Date   CHOLECYSTECTOMY N/A 04/01/2017   Procedure: LAPAROSCOPIC CHOLECYSTECTOMY;  Surgeon: Chuckie Casimiro KIDD, MD;  Location: [facility] OR;  Service: General;  Laterality: N/A;   RHINOPLASTY     VIDEO BRONCHOSCOPY WITH ENDOBRONCHIAL NAVIGATION N/A 07/11/2020   Procedure: VIDEO BRONCHOSCOPY WITH ENDOBRONCHIAL NAVIGATION  WITH INDOCYANINE GREEN /METHYLENE BLUE  MARKING INJECTION OF RIGHT UPPER LOBE MASS;  Surgeon: Army Dallas NOVAK, MD;  Location: [facility] OR;  Service: Thoracic;  Laterality: N/A;    Family Psychiatric History: Mother Schizoaffective Bipolar type  Family History:  Family History  Problem Relation Age of Onset   Hypertension Mother    Mental illness Mother    Depression Mother    Bipolar disorder Mother    Schizophrenia Mother    Heart disease Mother    Liver disease Mother    Alcohol abuse Mother    Drug abuse Mother    Obesity Mother    Asthma Mother    Migraines Mother    High blood pressure Father    Depression Father    Sleep apnea Father    Obesity Father    Asthma Father    Migraines Father    COPD Maternal Grandmother    Asthma Maternal Grandmother    Breast cancer Maternal Grandmother    Schizophrenia Maternal Grandfather    Bipolar disorder Maternal Grandfather    Asthma Maternal Grandfather    Seizures Paternal Grandmother    Atrial fibrillation Paternal Grandmother    Asthma Paternal Grandmother    Breast cancer Paternal Grandmother    Diabetes Paternal Grandfather    Congestive Heart Failure Paternal Grandfather    Asthma Paternal Grandfather    Breast cancer Paternal Aunt 40    Social History:  Social History   Socioeconomic History   Marital status: Single    Spouse name: Not on file   Number of children: Not on file   Years of education: Not on  file   Highest education level: Not on file  Occupational History   Occupation: conservation officer, nature  Tobacco Use   Smoking status: Never    Passive exposure: Yes   Smokeless tobacco: Never  Vaping Use   Vaping status: Never Used  Substance and Sexual Activity   Alcohol use: No   Drug use: No   Sexual activity: Never    Birth control/protection: Abstinence  Other Topics Concern   Not on file  Social History Narrative   [place] received her GED.   She attended [employer].   She lives with her dad only.   She has a younger brother.   She is ambidextrous.   1 8oz cup caffeine daily   Social Drivers of Health   Tobacco Use: Medium Risk (07/27/2024)   Patient History  Smoking Tobacco Use: Never    Smokeless Tobacco Use: Never    Passive Exposure: Yes  Financial Resource Strain: High Risk (03/17/2023)   Overall Financial Resource Strain (CARDIA)    Difficulty of Paying Living Expenses: Hard  Food Insecurity: Food Insecurity Present (09/29/2023)   Hunger Vital Sign    Worried About Running Out of Food in the Last Year: Sometimes true    Ran Out of Food in the Last Year: Never true  Transportation Needs: No Transportation Needs (03/17/2023)   PRAPARE - Administrator, Civil Service (Medical): No    Lack of Transportation (Non-Medical): No  Physical Activity: Sufficiently Active (03/17/2023)   Exercise Vital Sign    Days of Exercise per Week: 4 days    Minutes of Exercise per Session: 40 min  Stress: No Stress Concern Present (03/17/2023)   [employer] of Occupational Health - Occupational Stress Questionnaire    Feeling of Stress : Only a little  Social Connections: Socially Isolated (03/17/2023)   Social Connection and Isolation Panel    Frequency of Communication with Friends and Family: More than three times a week    Frequency of Social Gatherings with Friends and Family: Twice a week    Attends Religious Services: Never    Database Administrator or Organizations: No     Attends Banker Meetings: Never    Marital Status: Never married  Depression (PHQ2-9): Medium Risk (09/07/2024)   Depression (PHQ2-9)    PHQ-2 Score: 10  Alcohol Screen: Low Risk (03/17/2023)   Alcohol Screen    Last Alcohol Screening Score (AUDIT): 0  Housing: High Risk (03/17/2023)   Housing    Last Housing Risk Score: 2  Utilities: At Risk (03/17/2023)   AHC Utilities    Threatened with loss of utilities: Yes  Health Literacy: Adequate Health Literacy (05/26/2023)   B1300 Health Literacy    Frequency of need for help with medical instructions: Never    Allergies:  Allergies  Allergen Reactions   Penicillins Anaphylaxis, Hives and Swelling    Has patient had a PCN reaction causing immediate rash, facial/tongue/throat swelling, SOB or lightheadedness with hypotension: yes Has patient had a PCN reaction causing severe rash involving mucus membranes or skin necrosis: no Has patient had a PCN reaction that required hospitalization: no Has patient had a PCN reaction occurring within the last 10 years: bo If all of the above answers are NO, then may proceed with Cephalosporin use.     Metabolic Disorder Labs: Lab Results  Component Value Date   HGBA1C 5.5 03/31/2023   No results found for: PROLACTIN Lab Results  Component Value Date   CHOL 146 03/31/2023   TRIG 127 03/31/2023   HDL 45 03/31/2023   CHOLHDL 3.2 03/31/2023   LDLCALC 78 03/31/2023   LDLCALC 79 03/05/2022   Lab Results  Component Value Date   TSH 2.880 03/05/2022   TSH 1.410 04/24/2020    Therapeutic Level Labs: No results found for: LITHIUM No results found for: VALPROATE No results found for: CBMZ  Current Medications: Current Outpatient Medications  Medication Sig Dispense Refill   ARIPiprazole  (ABILIFY ) 2 MG tablet Take 1 tablet (2 mg total) by mouth daily. 30 tablet 3   albuterol  (VENTOLIN  HFA) 108 (90 Base) MCG/ACT inhaler Inhale 2 puffs into the lungs every 6 (six) hours as  needed for wheezing or shortness of breath. 18 g 5   cetirizine  (ZYRTEC ) 10 MG tablet Take 1 tablet (10 mg  total) by mouth daily. 90 tablet 3   esomeprazole  (NEXIUM ) 40 MG capsule TAKE 1 CAPSULE(40 MG) BY MOUTH DAILY 90 capsule 1   Ferric Maltol  (ACCRUFER ) 30 MG CAPS Take 1 capsule (30 mg total) by mouth 2 (two) times daily. 60 capsule 3   fluticasone  (FLONASE ) 50 MCG/ACT nasal spray Place 2 sprays into both nostrils daily as needed. 18.2 mL 3   meloxicam  (MOBIC ) 15 MG tablet Take 1 tablet (15 mg total) by mouth daily. 30 tablet 0   montelukast  (SINGULAIR ) 10 MG tablet Take 1 tablet (10 mg total) by mouth at bedtime. 90 tablet 3   Vitamin D , Ergocalciferol , (DRISDOL ) 1.25 MG (50000 UNIT) CAPS capsule Take 1 capsule (50,000 Units total) by mouth every 7 (seven) days. 12 capsule 1   No current facility-administered medications for this visit.     Musculoskeletal: Strength & Muscle Tone: within normal limits and telehealth visit Gait & Station: normal, telehealth visit Patient leans: N/A  Psychiatric Specialty Exam: Review of Systems  There were no vitals taken for this visit.There is no height or weight on file to calculate BMI.  General Appearance: Well Groomed  Eye Contact:  Good  Speech:  Clear and Coherent and Normal Rate  Volume:  Normal  Mood:  Anxious, Depressed, and mild  Affect:  Appropriate and Congruent  Thought Process:  Coherent, Goal Directed and Linear  Orientation:  Full (Time, Place, and Person)  Thought Content: Logical and Paranoid Ideation   Suicidal Thoughts:  No  Homicidal Thoughts:  No  Memory:  Immediate;   Good Recent;   Good Remote;   Good  Judgement:  Good  Insight:  Good  Psychomotor Activity:  Normal  Concentration:  Concentration: Good and Attention Span: Good  Recall:  Good  Fund of Knowledge: Good  Language: Good  Akathisia:  No  Handed:  Right  AIMS (if indicated):  not done  Assets:  Communication Skills Desire for Improvement Financial  Resources/Insurance Housing Social Support  ADL's:  Intact  Cognition: WNL  Sleep:  Fair   Screenings: AIMS    Flowsheet Row Clinical Support from 02/11/2024 in [facility] Clinical Support from 04/09/2023 in [facility]  AIMS Total Score 0 0   GAD-7    Flowsheet Row Video Visit from 09/07/2024 in [facility] Clinical Support from 06/14/2024 in [facility] Office Visit from 04/25/2024 in [facility] Primary [facility] & Sports Medicine at [facility] Clinical Support from 02/11/2024 in [facility] Office Visit from 01/22/2024 in [facility] Primary Care & Sports Medicine at [facility]  Total GAD-7 Score 10 5 4 5  0   PHQ2-9    Flowsheet Row Video Visit from 09/07/2024 in [facility] Clinical Support from 06/14/2024 in [facility] Office Visit from 04/25/2024 in [facility] Primary Care & Sports Medicine at [facility] Clinical Support from 02/11/2024 in [facility] Office Visit from 01/22/2024 in [facility] Primary Care & Sports Medicine at [facility]  PHQ-2 Total Score 4 1 1 1  0  PHQ-9 Total Score 10 6 7 4 4    Flowsheet Row Counselor from 08/19/2022 in [facility] Clinical Support from 07/29/2022 in [facility] Video Visit from 03/12/2021 in [facility]  C-SSRS RISK CATEGORY No Risk Error: Q7 should not be populated when Q6 is No No  Risk     Assessment and Plan: Patient reports that her mood, anxiety, and depression, has worsened since her last visit.  She discontinued Abilify  a month ago due to fatigue.Today Abilify  2 mg restarted to help manage mood and depression as 5 mg caused increased fatigue.  Provider informed patient that a mood stabilizer or antidepressant  can be trialed at the future if Abilify  make her fatigued.  She endorsed understanding and agreed.   1. Bipolar 2 disorder, major depressive episode (HCC) (Primary)  Start- ARIPiprazole  (ABILIFY ) 2 MG tablet; Take 1 tablet (2 mg total) by mouth daily.  Dispense: 30 tablet; Refill: 3      Follow-up in 2.5 month Follow-up with therapy  Zane FORBES Bach, NP 09/07/2024, 12:45 PM

## 2024-09-21 ENCOUNTER — Ambulatory Visit (HOSPITAL_COMMUNITY): Admitting: Mental Health

## 2024-09-21 DIAGNOSIS — F3181 Bipolar II disorder: Secondary | ICD-10-CM | POA: Diagnosis not present

## 2024-09-21 NOTE — Progress Notes (Unsigned)
" ° °  THERAPIST PROGRESS NOTE  Session Time: 9:06 am ( 46 minutes)  Participation Level: Active  Behavioral Response: CasualAlertEuthymic  Type of Therapy: Individual Therapy  Treatment Goals addressed: STG: Ardice will increase management of stressors AEB development of effective decision making skills with ability to identify realistic vs unrealistic stressors within the next 6 months   Treatment Goals addressed: STG: Monserat increase management of moods(depressive/manic episodes) AEB development of effective coping skills with ability to reframe maladaptive thinking within the next 6 months   ProgressTowards Goals: Progressing  Interventions: Supportive  Summary: Ashley Cantu is a 23 y.o. female who presents with dx of bipolar disorder, GAD and ADHD. Ashley Cantu presents alert and oriented. Mood and affect stable, bright. Engaged and receptive to interventions. Speech clear and coherent at normal rate and tone. Engages in jovial manner with therapist. Ashley Cantu for moods to be stable and notes decrease in medication at last med check. Shares working to engage in appropriate choices for self. Engages with therapist and reports upcoming return to school full time as well as working full time. Processes with therapist working to incorporate skills, schedule and habits to support in ability to manage tasks needed. Shares desires for future and explores with therapist ways in which she would like to be different from family of origin. Shares with therapist thoughts on holidays and passing of mother. Ongoing work towards goals with managing of stressors and ability to identify plans for realistic stressors. No distortions in thoughts reported. No safety concerns.  Suicidal/Homicidal: Nowithout intent/plan  Therapist Response: Therapist engaged Ashley Cantu in therapy session. Check in and assessed for current level of functioning, sxs management and level of stressors. Provided safe space for Ashley Cantu to  shares thoughts and feelings in regards to current level of functioning. Supported in exploration of managing of upcoming increase in schedule and engaged Ashley Cantu in exploring ability to incorporate time management, routine and other skills to support in meeting demands of school an work. Supported in processing hx of family dynamics and personal desire for better future. Reviewed session and provided follow up  Plan: Return again in  x5 weeks.  Diagnosis: No diagnosis found.  Collaboration of Care: Other None  Patient/Guardian was advised Release of Information must be obtained prior to any record release in order to collaborate their care with an outside provider. Patient/Guardian was advised if they have not already done so to contact the registration department to sign all necessary forms in order for us  to release information regarding their care.   Consent: Patient/Guardian gives verbal consent for treatment and assignment of benefits for services provided during this visit. Patient/Guardian expressed understanding and agreed to proceed.   Ashley Cantu, [facility] 09/21/2024  "

## 2024-11-09 ENCOUNTER — Ambulatory Visit (HOSPITAL_COMMUNITY): Admitting: Mental Health

## 2024-11-17 ENCOUNTER — Encounter (HOSPITAL_COMMUNITY): Admitting: Psychiatry

## 2025-04-27 ENCOUNTER — Encounter (HOSPITAL_BASED_OUTPATIENT_CLINIC_OR_DEPARTMENT_OTHER): Admitting: Family Medicine
